# Patient Record
Sex: Male | Born: 1965 | Race: White | Hispanic: No | Marital: Married | Smoking: Never smoker
Health system: Southern US, Community
[De-identification: ages and names within clinical notes are randomized; demographics above are authoritative.]

## PROBLEM LIST (undated history)

## (undated) DIAGNOSIS — Z8781 Personal history of (healed) traumatic fracture: Secondary | ICD-10-CM

## (undated) DIAGNOSIS — F32A Depression, unspecified: Secondary | ICD-10-CM

## (undated) DIAGNOSIS — E785 Hyperlipidemia, unspecified: Secondary | ICD-10-CM

## (undated) DIAGNOSIS — T7840XA Allergy, unspecified, initial encounter: Secondary | ICD-10-CM

## (undated) DIAGNOSIS — K219 Gastro-esophageal reflux disease without esophagitis: Secondary | ICD-10-CM

## (undated) DIAGNOSIS — I10 Essential (primary) hypertension: Secondary | ICD-10-CM

## (undated) DIAGNOSIS — I471 Supraventricular tachycardia, unspecified: Secondary | ICD-10-CM

## (undated) DIAGNOSIS — Z87820 Personal history of traumatic brain injury: Secondary | ICD-10-CM

## (undated) HISTORY — PX: FRACTURE SURGERY: SHX138

## (undated) HISTORY — PX: SHOULDER SURGERY: SHX246

## (undated) HISTORY — DX: Essential (primary) hypertension: I10

## (undated) HISTORY — PX: OTHER SURGICAL HISTORY: SHX169

## (undated) HISTORY — DX: Depression, unspecified: F32.A

## (undated) HISTORY — DX: Hyperlipidemia, unspecified: E78.5

## (undated) HISTORY — PX: SEPTOPLASTY: SUR1290

## (undated) HISTORY — DX: Allergy, unspecified, initial encounter: T78.40XA

## (undated) HISTORY — DX: Gastro-esophageal reflux disease without esophagitis: K21.9

## (undated) HISTORY — DX: Personal history of traumatic brain injury: Z87.820

## (undated) HISTORY — PX: PENILE CYST REMOVAL: SHX2199

## (undated) HISTORY — PX: TONSILLECTOMY: SHX5217

---

## 2004-02-11 ENCOUNTER — Other Ambulatory Visit: Payer: Self-pay

## 2004-02-11 ENCOUNTER — Emergency Department: Payer: Self-pay | Admitting: Emergency Medicine

## 2005-03-26 ENCOUNTER — Ambulatory Visit: Payer: Self-pay | Admitting: Family Medicine

## 2008-01-01 ENCOUNTER — Ambulatory Visit: Payer: Self-pay | Admitting: Family Medicine

## 2008-01-16 ENCOUNTER — Ambulatory Visit: Payer: Self-pay | Admitting: Family Medicine

## 2008-06-04 ENCOUNTER — Emergency Department: Payer: Self-pay | Admitting: Emergency Medicine

## 2008-06-06 ENCOUNTER — Ambulatory Visit: Payer: Self-pay | Admitting: Internal Medicine

## 2008-06-10 ENCOUNTER — Emergency Department: Payer: Self-pay | Admitting: Emergency Medicine

## 2008-06-14 ENCOUNTER — Ambulatory Visit: Payer: Self-pay | Admitting: Sports Medicine

## 2009-03-03 ENCOUNTER — Ambulatory Visit: Payer: Self-pay | Admitting: Internal Medicine

## 2011-05-07 ENCOUNTER — Emergency Department: Payer: Self-pay | Admitting: Emergency Medicine

## 2011-05-07 LAB — CBC WITH DIFFERENTIAL/PLATELET
Basophil #: 0 10*3/uL (ref 0.0–0.1)
Comment - H1-Com3: NORMAL
Eosinophil #: 0.2 10*3/uL (ref 0.0–0.7)
Eosinophil: 5 %
HCT: 42.7 % (ref 40.0–52.0)
HGB: 15.2 g/dL (ref 13.0–18.0)
Lymphocyte #: 2.1 10*3/uL (ref 1.0–3.6)
MCH: 33.4 pg (ref 26.0–34.0)
MCHC: 35.6 g/dL (ref 32.0–36.0)
MCV: 94 fL (ref 80–100)
Monocyte %: 6.1 %
Neutrophil #: 3.9 10*3/uL (ref 1.4–6.5)
RDW: 13 % (ref 11.5–14.5)
WBC: 6.7 10*3/uL (ref 3.8–10.6)

## 2012-04-13 DIAGNOSIS — M25511 Pain in right shoulder: Secondary | ICD-10-CM | POA: Insufficient documentation

## 2013-04-12 ENCOUNTER — Emergency Department: Payer: Self-pay | Admitting: Emergency Medicine

## 2013-04-12 LAB — URINALYSIS, COMPLETE
BILIRUBIN, UR: NEGATIVE
BLOOD: NEGATIVE
Bacteria: NONE SEEN
Glucose,UR: NEGATIVE mg/dL (ref 0–75)
KETONE: NEGATIVE
Leukocyte Esterase: NEGATIVE
Nitrite: NEGATIVE
Ph: 7 (ref 4.5–8.0)
Protein: NEGATIVE
RBC,UR: 1 /HPF (ref 0–5)
Specific Gravity: 1.033 (ref 1.003–1.030)
Squamous Epithelial: NONE SEEN
WBC UR: 1 /HPF (ref 0–5)

## 2013-04-12 LAB — COMPREHENSIVE METABOLIC PANEL
ALT: 53 U/L (ref 12–78)
Albumin: 4.1 g/dL (ref 3.4–5.0)
Alkaline Phosphatase: 81 U/L
Anion Gap: 5 — ABNORMAL LOW (ref 7–16)
BUN: 13 mg/dL (ref 7–18)
Bilirubin,Total: 0.6 mg/dL (ref 0.2–1.0)
CO2: 24 mmol/L (ref 21–32)
Calcium, Total: 8.6 mg/dL (ref 8.5–10.1)
Chloride: 107 mmol/L (ref 98–107)
Creatinine: 0.94 mg/dL (ref 0.60–1.30)
EGFR (African American): >60
EGFR (Non-African Amer.): >60
Glucose: 87 mg/dL (ref 65–99)
Osmolality: 271 (ref 275–301)
POTASSIUM: 4.7 mmol/L (ref 3.5–5.1)
SGOT(AST): 48 U/L — ABNORMAL HIGH (ref 15–37)
Sodium: 136 mmol/L (ref 136–145)
Total Protein: 7.5 g/dL (ref 6.4–8.2)

## 2013-04-12 LAB — CBC
HCT: 41.6 % (ref 40.0–52.0)
HGB: 14.8 g/dL (ref 13.0–18.0)
MCH: 32.7 pg (ref 26.0–34.0)
MCHC: 35.6 g/dL (ref 32.0–36.0)
MCV: 92 fL (ref 80–100)
PLATELETS: 178 10*3/uL (ref 150–440)
RBC: 4.52 10*6/uL (ref 4.40–5.90)
RDW: 12.6 % (ref 11.5–14.5)
WBC: 6.3 10*3/uL (ref 3.8–10.6)

## 2013-04-16 ENCOUNTER — Emergency Department: Payer: Self-pay | Admitting: Emergency Medicine

## 2014-06-14 ENCOUNTER — Encounter: Payer: Self-pay | Admitting: Family Medicine

## 2014-06-14 DIAGNOSIS — E785 Hyperlipidemia, unspecified: Secondary | ICD-10-CM | POA: Insufficient documentation

## 2014-06-14 DIAGNOSIS — I10 Essential (primary) hypertension: Secondary | ICD-10-CM | POA: Insufficient documentation

## 2014-07-30 ENCOUNTER — Encounter: Payer: Self-pay | Admitting: Family Medicine

## 2014-08-08 ENCOUNTER — Encounter: Payer: Self-pay | Admitting: Family Medicine

## 2014-08-13 ENCOUNTER — Encounter: Payer: Self-pay | Admitting: Family Medicine

## 2014-09-26 ENCOUNTER — Ambulatory Visit (INDEPENDENT_AMBULATORY_CARE_PROVIDER_SITE_OTHER): Payer: 59 | Admitting: Family Medicine

## 2014-09-26 ENCOUNTER — Encounter: Payer: Self-pay | Admitting: Family Medicine

## 2014-09-26 VITALS — BP 100/65 | HR 59 | Temp 98.2°F | Ht 73.0 in | Wt 248.0 lb

## 2014-09-26 DIAGNOSIS — F32A Depression, unspecified: Secondary | ICD-10-CM | POA: Insufficient documentation

## 2014-09-26 DIAGNOSIS — Z0000 Encounter for general adult medical examination without abnormal findings: Secondary | ICD-10-CM | POA: Diagnosis not present

## 2014-09-26 DIAGNOSIS — E039 Hypothyroidism, unspecified: Secondary | ICD-10-CM

## 2014-09-26 DIAGNOSIS — E785 Hyperlipidemia, unspecified: Secondary | ICD-10-CM | POA: Diagnosis not present

## 2014-09-26 DIAGNOSIS — I10 Essential (primary) hypertension: Secondary | ICD-10-CM

## 2014-09-26 DIAGNOSIS — F329 Major depressive disorder, single episode, unspecified: Secondary | ICD-10-CM | POA: Diagnosis not present

## 2014-09-26 LAB — URINALYSIS, ROUTINE W REFLEX MICROSCOPIC
Bilirubin, UA: NEGATIVE
GLUCOSE, UA: NEGATIVE
Leukocytes, UA: NEGATIVE
NITRITE UA: NEGATIVE
RBC, UA: NEGATIVE
SPEC GRAV UA: 1.02 (ref 1.005–1.030)
UUROB: 0.2 mg/dL (ref 0.2–1.0)
pH, UA: 7 (ref 5.0–7.5)

## 2014-09-26 MED ORDER — BENAZEPRIL HCL 40 MG PO TABS: 40.0000 mg | ORAL_TABLET | Freq: Every day | ORAL | Status: DC

## 2014-09-26 MED ORDER — ATORVASTATIN CALCIUM 40 MG PO TABS: 40.0000 mg | ORAL_TABLET | Freq: Every day | ORAL | Status: DC

## 2014-09-26 MED ORDER — AMLODIPINE BESYLATE 5 MG PO TABS: 5.0000 mg | ORAL_TABLET | Freq: Every day | ORAL | Status: DC

## 2014-09-26 MED ORDER — PAROXETINE HCL 20 MG PO TABS: 20.0000 mg | ORAL_TABLET | Freq: Every day | ORAL | Status: DC

## 2014-09-26 NOTE — Progress Notes (Addendum)
BP 100/65 mmHg  Pulse 59  Temp(Src) 98.2 F (36.8 C)  Ht 6\' 1"  (1.854 m)  Wt 248 lb (112.492 kg)  BMI 32.73 kg/m2  SpO2 97%   Subjective:    Patient ID: Anthony Hunter, male    DOB: [birth year], 49 y.o.   MRN: [id number]  HPI: Anthony Hunter is a 49 y.o. male  Chief Complaint  Patient presents with  . Annual Exam   patient doing well with blood pressure going well with weight loss and training for [employer]. Is having some lightheaded dizziness with going from lying to standing. Blood pressure is been doing very well No issues with atorvastatin 40 mg taken half a tablet.  Relevant past medical, surgical, family and social history reviewed and updated as indicated. Interim medical history since our last visit reviewed. Allergies and medications reviewed and updated.  Review of Systems  Constitutional: Negative.   HENT: Negative.   Eyes: Negative.   Respiratory: Negative.   Cardiovascular: Negative.   Endocrine: Negative.   Musculoskeletal: Negative.   Skin: Negative.   Allergic/Immunologic: Negative.   Neurological: Negative.   Hematological: Negative.   Psychiatric/Behavioral: Negative.     Per HPI unless specifically indicated above     Objective:    BP 100/65 mmHg  Pulse 59  Temp(Src) 98.2 F (36.8 C)  Ht 6\' 1"  (1.854 m)  Wt 248 lb (112.492 kg)  BMI 32.73 kg/m2  SpO2 97%  Wt Readings from Last 3 Encounters:  09/26/14 248 lb (112.492 kg)  05/29/14 259 lb (117.482 kg)    Physical Exam  Constitutional: He is oriented to person, place, and time. He appears well-developed and well-nourished.  HENT:  Head: Normocephalic and atraumatic.  Right Ear: External ear normal.  Left Ear: External ear normal.  Eyes: Conjunctivae and EOM are normal. Pupils are equal, round, and reactive to light.  Neck: Normal range of motion. Neck supple.  Cardiovascular: Normal rate, regular rhythm, normal heart sounds and intact distal pulses.   Pulmonary/Chest: Effort normal and breath  sounds normal.  Abdominal: Soft. Bowel sounds are normal. There is no splenomegaly or hepatomegaly.  Genitourinary: Rectum normal, prostate normal and penis normal.  Musculoskeletal: Normal range of motion.  Neurological: He is alert and oriented to person, place, and time. He has normal reflexes.  Skin: No rash noted. No erythema.  Psychiatric: He has a normal mood and affect. His behavior is normal. Judgment and thought content normal.        Assessment & Plan:   Problem List Items Addressed This Visit      Cardiovascular and Mediastinum   Hypertension - Primary    Discussed blood pressure being low with weight loss and exercise will decrease amlodipine from 10 mg to 5 mg May need to discontinue. We'll keep up with symptoms and blood pressure.      Relevant Medications   atorvastatin (LIPITOR) 40 MG tablet   benazepril (LOTENSIN) 40 MG tablet   amLODipine (NORVASC) 5 MG tablet     Other   Hyperlipidemia    The current medical regimen is effective;  continue present plan and medications.       Relevant Medications   atorvastatin (LIPITOR) 40 MG tablet   benazepril (LOTENSIN) 40 MG tablet   amLODipine (NORVASC) 5 MG tablet   Depression    The current medical regimen is effective;  continue present plan and medications.       Relevant Medications   PARoxetine (PAXIL) 20 MG tablet  Other Visit Diagnoses    PE (physical exam), annual        Relevant Orders    CBC with Differential/Platelet    Comprehensive metabolic panel    Urinalysis, Routine w reflex microscopic (not at [facility])    TSH    PSA    Lipid panel        Follow up plan: Return in about 6 months (around 03/29/2015), or if symptoms worsen or fail to improve, for recheck.      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 Patient will recheck TSH 2-3 weeks.

## 2014-09-26 NOTE — Assessment & Plan Note (Signed)
Discussed blood pressure being low with weight loss and exercise will decrease amlodipine from 10 mg to 5 mg May need to discontinue. We'll keep up with symptoms and blood pressure.

## 2014-09-26 NOTE — Assessment & Plan Note (Signed)
The current medical regimen is effective;  continue present plan and medications.  

## 2014-09-27 LAB — COMPREHENSIVE METABOLIC PANEL
A/G RATIO: 2 (ref 1.1–2.5)
ALT: 28 IU/L (ref 0–44)
AST: 30 IU/L (ref 0–40)
Albumin: 4.6 g/dL (ref 3.5–5.5)
Alkaline Phosphatase: 88 IU/L (ref 39–117)
BILIRUBIN TOTAL: 0.9 mg/dL (ref 0.0–1.2)
BUN/Creatinine Ratio: 10 (ref 9–20)
BUN: 11 mg/dL (ref 6–24)
CALCIUM: 9.6 mg/dL (ref 8.7–10.2)
CHLORIDE: 102 mmol/L (ref 97–108)
CO2: 25 mmol/L (ref 18–29)
CREATININE: 1.05 mg/dL (ref 0.76–1.27)
GFR calc Af Amer: 97 mL/min/{1.73_m2} (ref >59)
GFR, EST NON AFRICAN AMERICAN: 84 mL/min/{1.73_m2} (ref >59)
Globulin, Total: 2.3 g/dL (ref 1.5–4.5)
Glucose: 93 mg/dL (ref 65–99)
Potassium: 4.5 mmol/L (ref 3.5–5.2)
SODIUM: 142 mmol/L (ref 134–144)
TOTAL PROTEIN: 6.9 g/dL (ref 6.0–8.5)

## 2014-09-27 LAB — CBC WITH DIFFERENTIAL/PLATELET
Basophils Absolute: 0 10*3/uL (ref 0.0–0.2)
Basos: 0 %
EOS (ABSOLUTE): 0.1 10*3/uL (ref 0.0–0.4)
EOS: 3 %
Hematocrit: 41.6 % (ref 37.5–51.0)
Hemoglobin: 14.2 g/dL (ref 12.6–17.7)
IMMATURE GRANULOCYTES: 0 %
Immature Grans (Abs): 0 10*3/uL (ref 0.0–0.1)
LYMPHS ABS: 1.9 10*3/uL (ref 0.7–3.1)
Lymphs: 34 %
MCH: 32.2 pg (ref 26.6–33.0)
MCHC: 34.1 g/dL (ref 31.5–35.7)
MCV: 94 fL (ref 79–97)
MONOS ABS: 0.3 10*3/uL (ref 0.1–0.9)
Monocytes: 5 %
Neutrophils Absolute: 3.2 10*3/uL (ref 1.4–7.0)
Neutrophils: 58 %
PLATELETS: 207 10*3/uL (ref 150–379)
RBC: 4.41 x10E6/uL (ref 4.14–5.80)
RDW: 13.4 % (ref 12.3–15.4)
WBC: 5.6 10*3/uL (ref 3.4–10.8)

## 2014-09-27 LAB — LIPID PANEL
CHOLESTEROL TOTAL: 148 mg/dL (ref 100–199)
Chol/HDL Ratio: 2.8 ratio units (ref 0.0–5.0)
HDL: 52 mg/dL (ref >39)
LDL Calculated: 66 mg/dL (ref 0–99)
Triglycerides: 151 mg/dL — ABNORMAL HIGH (ref 0–149)
VLDL Cholesterol Cal: 30 mg/dL (ref 5–40)

## 2014-09-27 LAB — TSH: TSH: 4.62 u[IU]/mL — AB (ref 0.450–4.500)

## 2014-09-27 LAB — PSA: PROSTATE SPECIFIC AG, SERUM: 0.9 ng/mL (ref 0.0–4.0)

## 2014-09-30 NOTE — Addendum Note (Signed)
Addended byGolden Pop on: 09/30/2014 12:29 PM   Modules accepted: Orders, SmartSet

## 2015-02-10 ENCOUNTER — Other Ambulatory Visit: Payer: Self-pay | Admitting: Family Medicine

## 2015-04-01 ENCOUNTER — Encounter: Payer: Self-pay | Admitting: Family Medicine

## 2015-04-01 ENCOUNTER — Ambulatory Visit (INDEPENDENT_AMBULATORY_CARE_PROVIDER_SITE_OTHER): Payer: 59 | Admitting: Family Medicine

## 2015-04-01 VITALS — BP 123/85 | HR 71 | Temp 98.5°F | Ht 74.0 in | Wt 262.0 lb

## 2015-04-01 DIAGNOSIS — E039 Hypothyroidism, unspecified: Secondary | ICD-10-CM

## 2015-04-01 DIAGNOSIS — F329 Major depressive disorder, single episode, unspecified: Secondary | ICD-10-CM

## 2015-04-01 DIAGNOSIS — I10 Essential (primary) hypertension: Secondary | ICD-10-CM

## 2015-04-01 DIAGNOSIS — E785 Hyperlipidemia, unspecified: Secondary | ICD-10-CM

## 2015-04-01 DIAGNOSIS — F32A Depression, unspecified: Secondary | ICD-10-CM

## 2015-04-01 MED ORDER — AMLODIPINE BESYLATE 10 MG PO TABS: 10.0000 mg | ORAL_TABLET | Freq: Every day | ORAL | Status: DC

## 2015-04-01 NOTE — Assessment & Plan Note (Signed)
The current medical regimen is effective;  continue present plan and medications.  

## 2015-04-01 NOTE — Assessment & Plan Note (Signed)
Doing well with medications no complaints

## 2015-04-01 NOTE — Progress Notes (Signed)
BP 123/85 mmHg  Pulse 71  Temp(Src) 98.5 F (36.9 C)  Ht 6\' 2"  (1.88 m)  Wt 262 lb (118.842 kg)  BMI 33.62 kg/m2  SpO2 96%   Subjective:    Patient ID: Anthony Hunter, male    DOB: [birth year], 50 y.o.   MRN: [id number]  HPI: Anthony Hunter is a 50 y.o. male  Chief Complaint  Patient presents with  . Hypertension  . Hypothyroidism   patient recheck medications doing well with no side effects taking medications faithfully Blood pressure good control Last TSH was elevated patient with no thyroid symptoms Relevant past medical, surgical, family and social history reviewed and updated as indicated. Interim medical history since our last visit reviewed. Allergies and medications reviewed and updated.  Review of Systems  Constitutional: Negative.   Respiratory: Negative.   Cardiovascular: Negative.     Per HPI unless specifically indicated above     Objective:    BP 123/85 mmHg  Pulse 71  Temp(Src) 98.5 F (36.9 C)  Ht 6\' 2"  (1.88 m)  Wt 262 lb (118.842 kg)  BMI 33.62 kg/m2  SpO2 96%  Wt Readings from Last 3 Encounters:  04/01/15 262 lb (118.842 kg)  09/26/14 248 lb (112.492 kg)  05/29/14 259 lb (117.482 kg)    Physical Exam  Constitutional: He is oriented to person, place, and time. He appears well-developed and well-nourished. No distress.  HENT:  Head: Normocephalic and atraumatic.  Right Ear: Hearing normal.  Left Ear: Hearing normal.  Nose: Nose normal.  Eyes: Conjunctivae and lids are normal. Right eye exhibits no discharge. Left eye exhibits no discharge. No scleral icterus.  Neck: Normal range of motion. Neck supple. No thyromegaly present.  Cardiovascular: Normal rate, regular rhythm and normal heart sounds.   Pulmonary/Chest: Effort normal and breath sounds normal. No respiratory distress.  Musculoskeletal: Normal range of motion.  Neurological: He is alert and oriented to person, place, and time.  Skin: Skin is intact. No rash noted.  Psychiatric: He has a  normal mood and affect. His speech is normal and behavior is normal. Judgment and thought content normal. Cognition and memory are normal.    Results for orders placed or performed in visit on 09/26/14  CBC with Differential/Platelet  Result Value Ref Range   WBC 5.6 3.4 - 10.8 x10E3/uL   RBC 4.41 4.14 - 5.80 x10E6/uL   Hemoglobin 14.2 12.6 - 17.7 g/dL   Hematocrit 41.6 37.5 - 51.0 %   MCV 94 79 - 97 fL   MCH 32.2 26.6 - 33.0 pg   MCHC 34.1 31.5 - 35.7 g/dL   RDW 13.4 12.3 - 15.4 %   Platelets 207 150 - 379 x10E3/uL   Neutrophils 58 %   Lymphs 34 %   Monocytes 5 %   Eos 3 %   Basos 0 %   Neutrophils Absolute 3.2 1.4 - 7.0 x10E3/uL   Lymphocytes Absolute 1.9 0.7 - 3.1 x10E3/uL   Monocytes Absolute 0.3 0.1 - 0.9 x10E3/uL   EOS (ABSOLUTE) 0.1 0.0 - 0.4 x10E3/uL   Basophils Absolute 0.0 0.0 - 0.2 x10E3/uL   Immature Granulocytes 0 %   Immature Grans (Abs) 0.0 0.0 - 0.1 x10E3/uL  Comprehensive metabolic panel  Result Value Ref Range   Glucose 93 65 - 99 mg/dL   BUN 11 6 - 24 mg/dL   Creatinine, Ser 1.05 0.76 - 1.27 mg/dL   GFR calc non Af Amer 84 >59 mL/min/1.73   GFR calc Af Amer 97 >  59 mL/min/1.73   BUN/Creatinine Ratio 10 9 - 20   Sodium 142 134 - 144 mmol/L   Potassium 4.5 3.5 - 5.2 mmol/L   Chloride 102 97 - 108 mmol/L   CO2 25 18 - 29 mmol/L   Calcium 9.6 8.7 - 10.2 mg/dL   Total Protein 6.9 6.0 - 8.5 g/dL   Albumin 4.6 3.5 - 5.5 g/dL   Globulin, Total 2.3 1.5 - 4.5 g/dL   Albumin/Globulin Ratio 2.0 1.1 - 2.5   Bilirubin Total 0.9 0.0 - 1.2 mg/dL   Alkaline Phosphatase 88 39 - 117 IU/L   AST 30 0 - 40 IU/L   ALT 28 0 - 44 IU/L  Urinalysis, Routine w reflex microscopic (not at [facility])  Result Value Ref Range   Specific Gravity, UA 1.020 1.005 - 1.030   pH, UA 7.0 5.0 - 7.5   Color, UA Yellow Yellow   Appearance Ur Clear Clear   Leukocytes, UA Negative Negative   Protein, UA Trace Negative/Trace   Glucose, UA Negative Negative   Ketones, UA Trace (A) Negative    RBC, UA Negative Negative   Bilirubin, UA Negative Negative   Urobilinogen, Ur 0.2 0.2 - 1.0 mg/dL   Nitrite, UA Negative Negative  TSH  Result Value Ref Range   TSH 4.620 (H) 0.450 - 4.500 uIU/mL  PSA  Result Value Ref Range   Prostate Specific Ag, Serum 0.9 0.0 - 4.0 ng/mL  Lipid panel  Result Value Ref Range   Cholesterol, Total 148 100 - 199 mg/dL   Triglycerides 151 (H) 0 - 149 mg/dL   HDL 52 >39 mg/dL   VLDL Cholesterol Cal 30 5 - 40 mg/dL   LDL Calculated 66 0 - 99 mg/dL   Chol/HDL Ratio 2.8 0.0 - 5.0 ratio units      Assessment & Plan:   Problem List Items Addressed This Visit      Cardiovascular and Mediastinum   Hypertension - Primary    The current medical regimen is effective;  continue present plan and medications.       Relevant Medications   amLODipine (NORVASC) 10 MG tablet   Other Relevant Orders   Basic metabolic panel     Other   Hyperlipidemia    Doing well with medications no complaints      Relevant Medications   amLODipine (NORVASC) 10 MG tablet   Other Relevant Orders   ALT   AST   Lipid panel   Depression    The current medical regimen is effective;  continue present plan and medications.        Other Visit Diagnoses    Hypothyroidism, unspecified hypothyroidism type            Follow up plan: Return in about 6 months (around 09/29/2015) for Physical Exam.

## 2015-04-02 ENCOUNTER — Telehealth: Payer: Self-pay | Admitting: Family Medicine

## 2015-04-02 ENCOUNTER — Encounter: Payer: Self-pay | Admitting: Family Medicine

## 2015-04-02 LAB — BASIC METABOLIC PANEL
BUN/Creatinine Ratio: 8 — ABNORMAL LOW (ref 9–20)
BUN: 8 mg/dL (ref 6–24)
CALCIUM: 9.5 mg/dL (ref 8.7–10.2)
CHLORIDE: 100 mmol/L (ref 96–106)
CO2: 23 mmol/L (ref 18–29)
Creatinine, Ser: 0.95 mg/dL (ref 0.76–1.27)
GFR calc Af Amer: 109 mL/min/{1.73_m2} (ref >59)
GFR calc non Af Amer: 94 mL/min/{1.73_m2} (ref >59)
GLUCOSE: 102 mg/dL — AB (ref 65–99)
POTASSIUM: 4.1 mmol/L (ref 3.5–5.2)
Sodium: 140 mmol/L (ref 134–144)

## 2015-04-02 LAB — LIPID PANEL
CHOLESTEROL TOTAL: 165 mg/dL (ref 100–199)
Chol/HDL Ratio: 3.5 ratio units (ref 0.0–5.0)
HDL: 47 mg/dL (ref >39)
LDL CALC: 71 mg/dL (ref 0–99)
Triglycerides: 235 mg/dL — ABNORMAL HIGH (ref 0–149)
VLDL Cholesterol Cal: 47 mg/dL — ABNORMAL HIGH (ref 5–40)

## 2015-04-02 LAB — ALT: ALT: 33 IU/L (ref 0–44)

## 2015-04-02 LAB — AST: AST: 24 IU/L (ref 0–40)

## 2015-04-02 LAB — TSH: TSH: 4.7 u[IU]/mL — ABNORMAL HIGH (ref 0.450–4.500)

## 2015-04-02 NOTE — Telephone Encounter (Signed)
-----   Message from Wynn Maudlin, [place] sent at 04/02/2015  4:52 PM EST ----- labs

## 2015-04-02 NOTE — Telephone Encounter (Signed)
Phone call Discussed with patient elevated TSH patient with no subclinical thyroid symptoms will observe for now recheck at physical earlier if has problems especially with intense exercises patient's training for [employer].

## 2015-07-15 ENCOUNTER — Emergency Department
Admission: EM | Admit: 2015-07-15 | Discharge: 2015-07-15 | Disposition: A | Discharge status: Home / Self Care | Payer: 59 | Attending: Emergency Medicine | Admitting: Emergency Medicine

## 2015-07-15 ENCOUNTER — Encounter: Payer: Self-pay | Admitting: Emergency Medicine

## 2015-07-15 DIAGNOSIS — Y929 Unspecified place or not applicable: Secondary | ICD-10-CM | POA: Insufficient documentation

## 2015-07-15 DIAGNOSIS — I10 Essential (primary) hypertension: Secondary | ICD-10-CM | POA: Diagnosis not present

## 2015-07-15 DIAGNOSIS — Y939 Activity, unspecified: Secondary | ICD-10-CM | POA: Diagnosis not present

## 2015-07-15 DIAGNOSIS — E785 Hyperlipidemia, unspecified: Secondary | ICD-10-CM | POA: Insufficient documentation

## 2015-07-15 DIAGNOSIS — S50861A Insect bite (nonvenomous) of right forearm, initial encounter: Secondary | ICD-10-CM | POA: Diagnosis present

## 2015-07-15 DIAGNOSIS — Y999 Unspecified external cause status: Secondary | ICD-10-CM | POA: Diagnosis not present

## 2015-07-15 DIAGNOSIS — Z79899 Other long term (current) drug therapy: Secondary | ICD-10-CM | POA: Diagnosis not present

## 2015-07-15 DIAGNOSIS — F329 Major depressive disorder, single episode, unspecified: Secondary | ICD-10-CM | POA: Diagnosis not present

## 2015-07-15 DIAGNOSIS — W57XXXA Bitten or stung by nonvenomous insect and other nonvenomous arthropods, initial encounter: Secondary | ICD-10-CM | POA: Diagnosis not present

## 2015-07-15 DIAGNOSIS — L03113 Cellulitis of right upper limb: Secondary | ICD-10-CM | POA: Diagnosis not present

## 2015-07-15 DIAGNOSIS — L089 Local infection of the skin and subcutaneous tissue, unspecified: Secondary | ICD-10-CM

## 2015-07-15 MED ORDER — DOXYCYCLINE HYCLATE 100 MG PO TABS: 100.0000 mg | ORAL_TABLET | Freq: Two times a day (BID) | ORAL | Status: DC

## 2015-07-15 NOTE — ED Notes (Signed)
Presents with possible spider bite 2 days ago   Redness to arm

## 2015-07-15 NOTE — Discharge Instructions (Signed)

## 2015-07-15 NOTE — ED Notes (Signed)
Large red area noted to left forearm with red streak moving up arm
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
## 2015-07-31 ENCOUNTER — Ambulatory Visit (INDEPENDENT_AMBULATORY_CARE_PROVIDER_SITE_OTHER): Payer: 59 | Admitting: Family Medicine

## 2015-07-31 ENCOUNTER — Encounter: Payer: Self-pay | Admitting: Family Medicine

## 2015-07-31 VITALS — BP 128/88 | HR 69 | Temp 98.7°F | Wt 267.0 lb

## 2015-07-31 DIAGNOSIS — L03314 Cellulitis of groin: Secondary | ICD-10-CM

## 2015-07-31 MED ORDER — SULFAMETHOXAZOLE-TRIMETHOPRIM 800-160 MG PO TABS: 1.0000 | ORAL_TABLET | Freq: Two times a day (BID) | ORAL | Status: DC

## 2015-07-31 NOTE — Progress Notes (Signed)
BP 128/88 mmHg  Pulse 69  Temp(Src) 98.7 F (37.1 C)  Wt 267 lb (121.11 kg)  SpO2 100%   Subjective:    Patient ID: Anthony Hunter, male    DOB: Jan 23, 1966, 50 y.o.   MRN: [id number]  HPI: Jameer Diab is a 50 y.o. male  Chief Complaint  Patient presents with  . Mass    in groin, noticed two weeks ago, over the last three days it has gotten a lot larger   LUMP Duration: 2.5 weeks over night got much larger and he got very concerned Location: L groin Onset: sudden Painful: yes since yesterday Discomfort: yes Status:  bigger Trauma: no Redness: no Bruising: no Recent infection: yes- tick bite on his arm and was on doxy 2 weeks  Swollen lymph nodes: no Requesting removal: no History of cancer: no Family history of cancer: yes- brother had kidney cancer History of the same: no Associated signs and symptoms: not hot, changing color and getting darker  Relevant past medical, surgical, family and social history reviewed and updated as indicated. Interim medical history since our last visit reviewed. Allergies and medications reviewed and updated.  Review of Systems  Constitutional: Negative.   Respiratory: Negative.   Cardiovascular: Negative.     Per HPI unless specifically indicated above     Objective:    BP 128/88 mmHg  Pulse 69  Temp(Src) 98.7 F (37.1 C)  Wt 267 lb (121.11 kg)  SpO2 100%  Wt Readings from Last 3 Encounters:  07/31/15 267 lb (121.11 kg)  07/15/15 262 lb (118.842 kg)  04/01/15 262 lb (118.842 kg)    Physical Exam  Constitutional: He is oriented to person, place, and time. He appears well-developed and well-nourished. No distress.  HENT:  Head: Normocephalic and atraumatic.  Right Ear: Hearing normal.  Left Ear: Hearing normal.  Nose: Nose normal.  Eyes: Conjunctivae and lids are normal. Right eye exhibits no discharge. Left eye exhibits no discharge. No scleral icterus.  Pulmonary/Chest: Effort normal. No respiratory distress.   Musculoskeletal: Normal range of motion.  Neurological: He is alert and oriented to person, place, and time.  Skin: Skin is warm, dry and intact. No rash noted. There is erythema. No pallor.  2 inch erythematous lesion in L central groin, non-fluctuant, mild heat  Psychiatric: He has a normal mood and affect. His speech is normal and behavior is normal. Judgment and thought content normal. Cognition and memory are normal.    Results for orders placed or performed in visit on [id number]  Basic metabolic panel  Result Value Ref Range   Glucose 102 (H) 65 - 99 mg/dL   BUN 8 6 - 24 mg/dL   Creatinine, Ser 0.95 0.76 - 1.27 mg/dL   GFR calc non Af Amer 94 >59 mL/min/1.73   GFR calc Af Amer 109 >59 mL/min/1.73   BUN/Creatinine Ratio 8 (L) 9 - 20   Sodium 140 134 - 144 mmol/L   Potassium 4.1 3.5 - 5.2 mmol/L   Chloride 100 96 - 106 mmol/L   CO2 23 18 - 29 mmol/L   Calcium 9.5 8.7 - 10.2 mg/dL  ALT  Result Value Ref Range   ALT 33 0 - 44 IU/L  AST  Result Value Ref Range   AST 24 0 - 40 IU/L  Lipid panel  Result Value Ref Range   Cholesterol, Total 165 100 - 199 mg/dL   Triglycerides 235 (H) 0 - 149 mg/dL   HDL 47 >39 mg/dL  VLDL Cholesterol Cal 47 (H) 5 - 40 mg/dL   LDL Calculated 71 0 - 99 mg/dL   Chol/HDL Ratio 3.5 0.0 - 5.0 ratio units  TSH  Result Value Ref Range   TSH 4.700 (H) 0.450 - 4.500 uIU/mL      Assessment & Plan:   Problem List Items Addressed This Visit    None    Visit Diagnoses    Cellulitis of groin    -  Primary    Will treat with bactrim. Use hot compresses as neeeded If not better by tomorrow or more flucuant, return for I&D        Follow up plan: Return if symptoms worsen or fail to improve.

## 2015-07-31 NOTE — Patient Instructions (Signed)

## 2015-09-28 ENCOUNTER — Other Ambulatory Visit: Payer: Self-pay | Admitting: Family Medicine

## 2015-09-28 DIAGNOSIS — F32A Depression, unspecified: Secondary | ICD-10-CM

## 2015-09-28 DIAGNOSIS — F329 Major depressive disorder, single episode, unspecified: Secondary | ICD-10-CM

## 2015-09-28 DIAGNOSIS — I10 Essential (primary) hypertension: Secondary | ICD-10-CM

## 2015-09-28 DIAGNOSIS — E785 Hyperlipidemia, unspecified: Secondary | ICD-10-CM

## 2016-05-05 ENCOUNTER — Emergency Department (HOSPITAL_COMMUNITY): Payer: 59

## 2016-05-05 ENCOUNTER — Encounter (HOSPITAL_COMMUNITY): Payer: Self-pay | Admitting: Emergency Medicine

## 2016-05-05 ENCOUNTER — Emergency Department (HOSPITAL_COMMUNITY)
Admission: EM | Admit: 2016-05-05 | Discharge: 2016-05-05 | Disposition: A | Discharge status: Home / Self Care | Payer: 59 | Attending: Emergency Medicine | Admitting: Emergency Medicine

## 2016-05-05 DIAGNOSIS — N200 Calculus of kidney: Secondary | ICD-10-CM

## 2016-05-05 DIAGNOSIS — I10 Essential (primary) hypertension: Secondary | ICD-10-CM | POA: Diagnosis not present

## 2016-05-05 DIAGNOSIS — R109 Unspecified abdominal pain: Secondary | ICD-10-CM

## 2016-05-05 LAB — URINALYSIS, ROUTINE W REFLEX MICROSCOPIC
Bilirubin Urine: NEGATIVE
Glucose, UA: NEGATIVE mg/dL
Hgb urine dipstick: NEGATIVE
KETONES UR: NEGATIVE mg/dL
Leukocytes, UA: NEGATIVE
NITRITE: NEGATIVE
PH: 7 (ref 5.0–8.0)
Protein, ur: NEGATIVE mg/dL
Specific Gravity, Urine: 1.016 (ref 1.005–1.030)

## 2016-05-05 LAB — CBC
HEMATOCRIT: 42.3 % (ref 39.0–52.0)
HEMOGLOBIN: 14.5 g/dL (ref 13.0–17.0)
MCH: 31.3 pg (ref 26.0–34.0)
MCHC: 34.3 g/dL (ref 30.0–36.0)
MCV: 91.2 fL (ref 78.0–100.0)
Platelets: 186 10*3/uL (ref 150–400)
RBC: 4.64 MIL/uL (ref 4.22–5.81)
RDW: 12.5 % (ref 11.5–15.5)
WBC: 6.8 10*3/uL (ref 4.0–10.5)

## 2016-05-05 LAB — COMPREHENSIVE METABOLIC PANEL
ALT: 48 U/L (ref 17–63)
ANION GAP: 10 (ref 5–15)
AST: 37 U/L (ref 15–41)
Albumin: 4.1 g/dL (ref 3.5–5.0)
Alkaline Phosphatase: 84 U/L (ref 38–126)
BILIRUBIN TOTAL: 0.8 mg/dL (ref 0.3–1.2)
BUN: 8 mg/dL (ref 6–20)
CALCIUM: 9.2 mg/dL (ref 8.9–10.3)
CO2: 24 mmol/L (ref 22–32)
Chloride: 106 mmol/L (ref 101–111)
Creatinine, Ser: 0.99 mg/dL (ref 0.61–1.24)
GFR calc Af Amer: >60 mL/min (ref >60)
Glucose, Bld: 107 mg/dL — ABNORMAL HIGH (ref 65–99)
POTASSIUM: 3.8 mmol/L (ref 3.5–5.1)
Sodium: 140 mmol/L (ref 135–145)
TOTAL PROTEIN: 6.8 g/dL (ref 6.5–8.1)

## 2016-05-05 LAB — LIPASE, BLOOD: Lipase: 15 U/L (ref 11–51)

## 2016-05-05 MED ORDER — ONDANSETRON 4 MG PO TBDP: 4.0000 mg | ORAL_TABLET | Freq: Three times a day (TID) | ORAL | 0 refills | Status: DC | PRN

## 2016-05-05 NOTE — ED Notes (Signed)
Pt A&OX4, ambulatory at d/c with independent steady gait, NAD

## 2016-05-05 NOTE — Discharge Instructions (Signed)
Your CT scan and lab work were reassuring today.  Your flank pain was most likely due to a recently pass kidney stone.  Please monitor your symptoms and return to ED if your flank pain worsens or returns. Take zofran for nausea as needed.

## 2016-05-05 NOTE — ED Triage Notes (Signed)
Pt developed sudden onset of left sided flank pain. Denies urinary symptoms. Pt feels nauseated.

## 2016-05-05 NOTE — ED Provider Notes (Signed)
Complains of flank pain rating to groin onset this morning while driving to work. Symptoms have improved spontaneously without treatment. Nothing makes symptoms better or worse. No other associated symptoms he's never had similar symptoms in the past. On exam no distress lungs clear auscultation abdomen nontender. No flank tenderness. Genitalia normal male   Orlie Dakin, MD 05/05/16 1113
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
## 2016-09-30 LAB — BASIC METABOLIC PANEL: Glucose: 90

## 2016-09-30 LAB — LIPID PANEL
Cholesterol: 216 — AB (ref 0–200)
HDL: 51 (ref 35–70)
LDL Cholesterol: 120
Triglycerides: 227 — AB (ref 40–160)

## 2016-09-30 LAB — HEMOGLOBIN A1C: HEMOGLOBIN A1C: 5.1

## 2016-10-17 ENCOUNTER — Other Ambulatory Visit: Payer: Self-pay | Admitting: Family Medicine

## 2016-10-17 DIAGNOSIS — F32A Depression, unspecified: Secondary | ICD-10-CM

## 2016-10-17 DIAGNOSIS — F329 Major depressive disorder, single episode, unspecified: Secondary | ICD-10-CM

## 2016-10-19 NOTE — Telephone Encounter (Signed)
Needs a follow up to get refills.

## 2016-10-20 ENCOUNTER — Ambulatory Visit (INDEPENDENT_AMBULATORY_CARE_PROVIDER_SITE_OTHER): Payer: 59 | Admitting: Family Medicine

## 2016-10-20 ENCOUNTER — Encounter: Payer: Self-pay | Admitting: Family Medicine

## 2016-10-20 VITALS — BP 134/84 | HR 60 | Temp 97.7°F | Ht 73.0 in | Wt 255.0 lb

## 2016-10-20 DIAGNOSIS — Z0000 Encounter for general adult medical examination without abnormal findings: Secondary | ICD-10-CM | POA: Diagnosis not present

## 2016-10-20 DIAGNOSIS — R946 Abnormal results of thyroid function studies: Secondary | ICD-10-CM | POA: Diagnosis not present

## 2016-10-20 DIAGNOSIS — I10 Essential (primary) hypertension: Secondary | ICD-10-CM

## 2016-10-20 DIAGNOSIS — F3342 Major depressive disorder, recurrent, in full remission: Secondary | ICD-10-CM

## 2016-10-20 DIAGNOSIS — E782 Mixed hyperlipidemia: Secondary | ICD-10-CM | POA: Diagnosis not present

## 2016-10-20 DIAGNOSIS — Z1211 Encounter for screening for malignant neoplasm of colon: Secondary | ICD-10-CM | POA: Diagnosis not present

## 2016-10-20 DIAGNOSIS — R7989 Other specified abnormal findings of blood chemistry: Secondary | ICD-10-CM

## 2016-10-20 MED ORDER — PAROXETINE HCL 20 MG PO TABS: 20.0000 mg | ORAL_TABLET | Freq: Every day | ORAL | 11 refills | Status: DC
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
## 2016-10-20 NOTE — Assessment & Plan Note (Signed)
Doing very well on paxil. Medication refilled today.

## 2016-10-20 NOTE — Assessment & Plan Note (Signed)
Long discussion with patient, who is very much wanting to try and avoid going back on statins. Continue good lifestyle modifications and fish oil, can try red yeast rice. Will re-evaluate in 6 months at follow up.

## 2016-10-20 NOTE — Assessment & Plan Note (Signed)
BPs well controlled on current regimen, continue benazepril and amlodipine.

## 2016-10-21 ENCOUNTER — Encounter: Payer: Self-pay | Admitting: Family Medicine

## 2016-10-21 LAB — UA/M W/RFLX CULTURE, ROUTINE
Bilirubin, UA: NEGATIVE
GLUCOSE, UA: NEGATIVE
KETONES UA: NEGATIVE
LEUKOCYTES UA: NEGATIVE
Nitrite, UA: NEGATIVE
Protein, UA: NEGATIVE
RBC, UA: NEGATIVE
Urobilinogen, Ur: 0.2 mg/dL (ref 0.2–1.0)
pH, UA: 7 (ref 5.0–7.5)

## 2016-10-21 LAB — CBC WITH DIFFERENTIAL/PLATELET
BASOS ABS: 0 10*3/uL (ref 0.0–0.2)
Basos: 0 %
EOS (ABSOLUTE): 0.2 10*3/uL (ref 0.0–0.4)
EOS: 4 %
HEMATOCRIT: 42.8 % (ref 37.5–51.0)
Hemoglobin: 14.9 g/dL (ref 13.0–17.7)
IMMATURE GRANULOCYTES: 0 %
Immature Grans (Abs): 0 10*3/uL (ref 0.0–0.1)
Lymphocytes Absolute: 2 10*3/uL (ref 0.7–3.1)
Lymphs: 35 %
MCH: 32.5 pg (ref 26.6–33.0)
MCHC: 34.8 g/dL (ref 31.5–35.7)
MCV: 93 fL (ref 79–97)
MONOS ABS: 0.4 10*3/uL (ref 0.1–0.9)
Monocytes: 8 %
NEUTROS PCT: 53 %
Neutrophils Absolute: 3 10*3/uL (ref 1.4–7.0)
PLATELETS: 148 10*3/uL — AB (ref 150–379)
RBC: 4.59 x10E6/uL (ref 4.14–5.80)
RDW: 13.3 % (ref 12.3–15.4)
WBC: 5.6 10*3/uL (ref 3.4–10.8)

## 2016-10-21 LAB — MICROSCOPIC EXAMINATION
BACTERIA UA: NONE SEEN
WBC, UA: NONE SEEN /hpf (ref 0–?)

## 2016-10-21 LAB — COMPREHENSIVE METABOLIC PANEL
ALK PHOS: 80 IU/L (ref 39–117)
ALT: 20 IU/L (ref 0–44)
AST: 22 IU/L (ref 0–40)
Albumin/Globulin Ratio: 2.1 (ref 1.2–2.2)
Albumin: 4.6 g/dL (ref 3.5–5.5)
BUN/Creatinine Ratio: 17 (ref 9–20)
BUN: 16 mg/dL (ref 6–24)
Bilirubin Total: 0.6 mg/dL (ref 0.0–1.2)
CALCIUM: 9.1 mg/dL (ref 8.7–10.2)
CHLORIDE: 101 mmol/L (ref 96–106)
CO2: 23 mmol/L (ref 20–29)
CREATININE: 0.94 mg/dL (ref 0.76–1.27)
GFR calc Af Amer: 109 mL/min/{1.73_m2} (ref >59)
GFR, EST NON AFRICAN AMERICAN: 94 mL/min/{1.73_m2} (ref >59)
GLUCOSE: 81 mg/dL (ref 65–99)
Globulin, Total: 2.2 g/dL (ref 1.5–4.5)
Potassium: 4.6 mmol/L (ref 3.5–5.2)
Sodium: 139 mmol/L (ref 134–144)
Total Protein: 6.8 g/dL (ref 6.0–8.5)

## 2016-10-21 LAB — TSH: TSH: 3.27 u[IU]/mL (ref 0.450–4.500)

## 2016-11-16 ENCOUNTER — Other Ambulatory Visit: Payer: Self-pay | Admitting: Family Medicine

## 2016-11-16 DIAGNOSIS — I10 Essential (primary) hypertension: Secondary | ICD-10-CM

## 2016-11-16 NOTE — Telephone Encounter (Signed)
Routing to provider. Next appt on 04/22/17.

## 2016-11-29 ENCOUNTER — Other Ambulatory Visit: Payer: Self-pay | Admitting: Family Medicine

## 2016-11-29 DIAGNOSIS — I10 Essential (primary) hypertension: Secondary | ICD-10-CM

## 2017-04-18 ENCOUNTER — Telehealth: Payer: Self-pay

## 2017-04-18 DIAGNOSIS — F3342 Major depressive disorder, recurrent, in full remission: Secondary | ICD-10-CM

## 2017-04-18 MED ORDER — PAROXETINE HCL 20 MG PO TABS: 20.0000 mg | ORAL_TABLET | Freq: Every day | ORAL | 1 refills | Status: DC

## 2017-04-18 NOTE — Telephone Encounter (Signed)
90 Day Supply for Paxil Appt 04/22/17

## 2017-04-18 NOTE — Telephone Encounter (Signed)
Copied from [place]. Topic: Quick Communication - Patient Running Late >> Apr 18, 2017  1:57 PM Oneta Rack wrote: Relation to pt: self  Call back number: [phone] Pharmacy: [facility] [place], [place] [phone] (Phone) [phone] (Fax)  Reason for call:  Patient requesting 3 month supply PARoxetine (PAXIL) 20 MG tablet, due to the cost being cheaper and due to patient traveling. Patient confirmed appointment with Merrie Roof  for 04/22/17. Pharmacy advised patient to contact pcp office due to the increase in supply, please advise >> Apr 18, 2017  2:00 PM Oneta Rack wrote: Relation to pt: self  Call back number: [phone] Pharmacy: [facility] [place], [place] [phone] (Phone) [phone] (Fax)  Reason for call:  Patient requesting 3 month supply PARoxetine (PAXIL) 20 MG tablet, due to the cost being cheaper and due to patient traveling. Patient confirmed appointment with Merrie Roof  for 04/22/17. Pharmacy advised patient to contact pcp office due to the increase in supply, please advise

## 2017-04-18 NOTE — Telephone Encounter (Signed)
Rx sent 

## 2017-04-22 ENCOUNTER — Encounter: Payer: Self-pay | Admitting: Family Medicine

## 2017-04-22 ENCOUNTER — Ambulatory Visit: Payer: Managed Care, Other (non HMO) | Admitting: Family Medicine

## 2017-04-22 VITALS — BP 140/84 | HR 52 | Temp 97.5°F | Ht 73.0 in | Wt 239.3 lb

## 2017-04-22 DIAGNOSIS — Z8639 Personal history of other endocrine, nutritional and metabolic disease: Secondary | ICD-10-CM | POA: Insufficient documentation

## 2017-04-22 DIAGNOSIS — E782 Mixed hyperlipidemia: Secondary | ICD-10-CM | POA: Diagnosis not present

## 2017-04-22 DIAGNOSIS — F3342 Major depressive disorder, recurrent, in full remission: Secondary | ICD-10-CM

## 2017-04-22 DIAGNOSIS — I10 Essential (primary) hypertension: Secondary | ICD-10-CM

## 2017-04-22 NOTE — Progress Notes (Signed)
BP 140/84   Pulse (!) 52   Temp (!) 97.5 F (36.4 C) (Tympanic)   Ht 6\' 1"  (1.854 m)   Wt 239 lb 4.8 oz (108.5 kg)   SpO2 98%   BMI 31.57 kg/m    Subjective:    Patient ID: Anthony Hunter, male    DOB: 04-30-1965, 52 y.o.   MRN: [id number]  HPI: Anthony Hunter is a 52 y.o. male  Chief Complaint  Patient presents with  . Hypertension    Patient has lost weight and has been doing cross fit training. States he has had symptoms of low BP. Has not taken blood pressure medication +30 days.  . Vitamin D Follow Up   Pt here today for BP and cholesterol f/u. Was having hypotensive spells frequently while taking his BP medications. 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 Stopped all of his BP medications about a month ago due to these spells and no longer having issues. Home readings 130s/90s. Will be traveling to [place] in the next few weeks to compete. Denies CP, dizziness, syncope, SOB currently.  Wanted to try diet control for his cholesterol rather than starting a statin previously. Still hoping to continue this way. Has been training hard for his competition and has lost a significant amount of weight.   Continues to do well on paxil, no concerns or side effects. Feels it controls his moods well. Denies SI/HI, mood swings, sleep or appetite issues.   Also wanting his thyroid levels checked as they've been inconsistent in the past.  Depression screen [facility] 2/9 04/22/2017 10/20/2016  Decreased Interest 0 0  Down, Depressed, Hopeless 0 0  PHQ - 2 Score 0 0  Altered sleeping 0 -  Tired, decreased energy 0 -  Change in appetite 0 -  Feeling bad or failure about yourself  0 -  Trouble concentrating 0 -  Moving slowly or fidgety/restless 0 -  Suicidal thoughts 0 -  PHQ-9 Score 0 -    Relevant past medical, surgical, family and social history reviewed and updated as indicated. Interim medical history since our last visit reviewed. Allergies and medications reviewed and  updated.  Review of Systems  Per HPI unless specifically indicated above     Objective:    BP 140/84   Pulse (!) 52   Temp (!) 97.5 F (36.4 C) (Tympanic)   Ht 6\' 1"  (1.854 m)   Wt 239 lb 4.8 oz (108.5 kg)   SpO2 98%   BMI 31.57 kg/m   Wt Readings from Last 3 Encounters:  04/22/17 239 lb 4.8 oz (108.5 kg)  10/20/16 255 lb (115.7 kg)  05/05/16 250 lb (113.4 kg)    Physical Exam  Constitutional: He is oriented to person, place, and time. He appears well-developed and well-nourished. No distress.  HENT:  Head: Atraumatic.  Eyes: Conjunctivae are normal. Pupils are equal, round, and reactive to light.  Neck: Normal range of motion. Neck supple.  Cardiovascular: Normal rate and normal heart sounds.  Pulmonary/Chest: Effort normal and breath sounds normal. No respiratory distress.  Musculoskeletal: Normal range of motion.  Neurological: He is alert and oriented to person, place, and time.  Skin: Skin is warm and dry.  Psychiatric: He has a normal mood and affect. His behavior is normal.  Nursing note and vitals reviewed.   Results for orders placed or performed in visit on 04/22/17  Comprehensive metabolic panel  Result Value Ref Range   Glucose 97  65 - 99 mg/dL   BUN 17 6 - 24 mg/dL   Creatinine, Ser 0.96 0.76 - 1.27 mg/dL   GFR calc non Af Amer 92 >59 mL/min/1.73   GFR calc Af Amer 106 >59 mL/min/1.73   BUN/Creatinine Ratio 18 9 - 20   Sodium 140 134 - 144 mmol/L   Potassium 4.2 3.5 - 5.2 mmol/L   Chloride 103 96 - 106 mmol/L   CO2 24 20 - 29 mmol/L   Calcium 9.6 8.7 - 10.2 mg/dL   Total Protein 6.4 6.0 - 8.5 g/dL   Albumin 4.2 3.5 - 5.5 g/dL   Globulin, Total 2.2 1.5 - 4.5 g/dL   Albumin/Globulin Ratio 1.9 1.2 - 2.2   Bilirubin Total 0.5 0.0 - 1.2 mg/dL   Alkaline Phosphatase 64 39 - 117 IU/L   AST 28 0 - 40 IU/L   ALT 30 0 - 44 IU/L  Lipid Panel w/o Chol/HDL Ratio  Result Value Ref Range   Cholesterol, Total 202 (H) 100 - 199 mg/dL   Triglycerides 103 0 -  149 mg/dL   HDL 59 >39 mg/dL   VLDL Cholesterol Cal 21 5 - 40 mg/dL   LDL Calculated 122 (H) 0 - 99 mg/dL  TSH  Result Value Ref Range   TSH 5.030 (H) 0.450 - 4.500 uIU/mL      Assessment & Plan:   Problem List Items Addressed This Visit      Cardiovascular and Mediastinum   Hypertension - Primary    BPs high/normal today but given significant sxs on medication with his training will continue d/c for now. May add back 5 mg amlodipine alone at next visit and monitor for hypotension. 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 Will check levels today, continue fish oil and good diet and exercise.       Relevant Orders   Lipid Panel w/o Chol/HDL Ratio (Completed)   Depression    Stable, continue paxil. Well controlled      History of hypothyroidism    Recheck levels today. No new sxs      Relevant Orders   TSH (Completed)       Follow up plan: Return in about 6 months (around 10/23/2017) for CPE.

## 2017-04-23 LAB — LIPID PANEL W/O CHOL/HDL RATIO
Cholesterol, Total: 202 mg/dL — ABNORMAL HIGH (ref 100–199)
HDL: 59 mg/dL (ref >39)
LDL Calculated: 122 mg/dL — ABNORMAL HIGH (ref 0–99)
Triglycerides: 103 mg/dL (ref 0–149)
VLDL Cholesterol Cal: 21 mg/dL (ref 5–40)

## 2017-04-23 LAB — COMPREHENSIVE METABOLIC PANEL
A/G RATIO: 1.9 (ref 1.2–2.2)
ALBUMIN: 4.2 g/dL (ref 3.5–5.5)
ALT: 30 IU/L (ref 0–44)
AST: 28 IU/L (ref 0–40)
Alkaline Phosphatase: 64 IU/L (ref 39–117)
BUN / CREAT RATIO: 18 (ref 9–20)
BUN: 17 mg/dL (ref 6–24)
Bilirubin Total: 0.5 mg/dL (ref 0.0–1.2)
CALCIUM: 9.6 mg/dL (ref 8.7–10.2)
CO2: 24 mmol/L (ref 20–29)
Chloride: 103 mmol/L (ref 96–106)
Creatinine, Ser: 0.96 mg/dL (ref 0.76–1.27)
GFR, EST AFRICAN AMERICAN: 106 mL/min/{1.73_m2} (ref >59)
GFR, EST NON AFRICAN AMERICAN: 92 mL/min/{1.73_m2} (ref >59)
GLOBULIN, TOTAL: 2.2 g/dL (ref 1.5–4.5)
Glucose: 97 mg/dL (ref 65–99)
POTASSIUM: 4.2 mmol/L (ref 3.5–5.2)
SODIUM: 140 mmol/L (ref 134–144)
Total Protein: 6.4 g/dL (ref 6.0–8.5)

## 2017-04-23 LAB — TSH: TSH: 5.03 u[IU]/mL — AB (ref 0.450–4.500)

## 2017-04-25 ENCOUNTER — Telehealth: Payer: Self-pay | Admitting: Family Medicine

## 2017-04-25 MED ORDER — DIAZEPAM 2 MG PO TABS: 2.0000 mg | ORAL_TABLET | Freq: Two times a day (BID) | ORAL | 0 refills | Status: DC | PRN

## 2017-04-25 NOTE — Assessment & Plan Note (Signed)
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 Will check levels today, continue fish oil and good diet and exercise.

## 2017-04-25 NOTE — Assessment & Plan Note (Signed)
BPs high/normal today but given significant sxs on medication with his training will continue d/c for now. May add back 5 mg amlodipine alone at next visit and monitor for hypotension. Check BMP today

## 2017-04-25 NOTE — Telephone Encounter (Signed)
Copied from [place] [id number]. Topic: Quick Communication - Rx Refill/Question >> Apr 25, 2017  6:19 PM Oliver Pila B wrote: Medication: diazepam (VALIUM) 2 MG tablet [[id number]]   Has the patient contacted their pharmacy? No.   (Agent: If no, request that the patient contact the pharmacy for the refill.)   Preferred Pharmacy (with phone number or street name): [facility]   Agent: Please be advised that RX refills may take up to 3 business days. We ask that you follow-up with your pharmacy.

## 2017-04-25 NOTE — Assessment & Plan Note (Signed)
Stable, continue paxil. Well controlled

## 2017-04-25 NOTE — Telephone Encounter (Signed)
Rx for very small supply of valium for his flight anxiety printed and ready to fax

## 2017-04-25 NOTE — Patient Instructions (Signed)
Follow up in 6 months 

## 2017-04-25 NOTE — Assessment & Plan Note (Signed)
Recheck levels today. No new sxs

## 2017-04-25 NOTE — Telephone Encounter (Signed)
Copied from [place]. Topic: Inquiry >> Apr 25, 2017  2:04 PM Margot Ables wrote: International event in [place]. Flying out this Friday. Pt has flight anxiety and is asking for something to take for the flight there and back to help relieve anxiety and maybe help him to sleep on the plane. Pt states it is a flight from [place] to [place], then [place] to [place] (19 hr layover), [place] to [place].   [id number] - [address], [place] [phone] (Phone) [phone] (Fax)

## 2017-04-26 NOTE — Telephone Encounter (Signed)
Duplicate message. Please see previous telephone call.

## 2017-07-09 ENCOUNTER — Emergency Department
Admission: EM | Admit: 2017-07-09 | Discharge: 2017-07-09 | Disposition: A | Discharge status: Home / Self Care | Payer: Managed Care, Other (non HMO) | Attending: Emergency Medicine | Admitting: Emergency Medicine

## 2017-07-09 ENCOUNTER — Emergency Department: Payer: Managed Care, Other (non HMO)

## 2017-07-09 ENCOUNTER — Other Ambulatory Visit: Payer: Self-pay

## 2017-07-09 ENCOUNTER — Encounter: Payer: Self-pay | Admitting: Emergency Medicine

## 2017-07-09 DIAGNOSIS — S6991XA Unspecified injury of right wrist, hand and finger(s), initial encounter: Secondary | ICD-10-CM | POA: Diagnosis present

## 2017-07-09 DIAGNOSIS — S61210A Laceration without foreign body of right index finger without damage to nail, initial encounter: Secondary | ICD-10-CM | POA: Insufficient documentation

## 2017-07-09 DIAGNOSIS — E039 Hypothyroidism, unspecified: Secondary | ICD-10-CM | POA: Insufficient documentation

## 2017-07-09 DIAGNOSIS — W228XXA Striking against or struck by other objects, initial encounter: Secondary | ICD-10-CM | POA: Insufficient documentation

## 2017-07-09 DIAGNOSIS — Z79899 Other long term (current) drug therapy: Secondary | ICD-10-CM | POA: Insufficient documentation

## 2017-07-09 DIAGNOSIS — S61212A Laceration without foreign body of right middle finger without damage to nail, initial encounter: Secondary | ICD-10-CM | POA: Diagnosis not present

## 2017-07-09 DIAGNOSIS — Y9389 Activity, other specified: Secondary | ICD-10-CM | POA: Diagnosis not present

## 2017-07-09 DIAGNOSIS — Z23 Encounter for immunization: Secondary | ICD-10-CM | POA: Insufficient documentation

## 2017-07-09 DIAGNOSIS — Y929 Unspecified place or not applicable: Secondary | ICD-10-CM | POA: Insufficient documentation

## 2017-07-09 DIAGNOSIS — Y999 Unspecified external cause status: Secondary | ICD-10-CM | POA: Diagnosis not present

## 2017-07-09 DIAGNOSIS — I10 Essential (primary) hypertension: Secondary | ICD-10-CM | POA: Diagnosis not present

## 2017-07-09 HISTORY — DX: Personal history of (healed) traumatic fracture: Z87.81

## 2017-07-09 MED ORDER — LIDOCAINE HCL (PF) 1 % IJ SOLN
INTRAMUSCULAR | Status: AC
  Filled 2017-07-09: qty 5

## 2017-07-09 MED ORDER — CEPHALEXIN 500 MG PO CAPS
ORAL_CAPSULE | ORAL | Status: AC
  Filled 2017-07-09: qty 1

## 2017-07-09 MED ORDER — TETANUS-DIPHTH-ACELL PERTUSSIS 5-2.5-18.5 LF-MCG/0.5 IM SUSP
INTRAMUSCULAR | Status: AC
  Administered 2017-07-09: 0.5 mL via INTRAMUSCULAR
  Filled 2017-07-09: qty 0.5

## 2017-07-09 MED ORDER — CEPHALEXIN 500 MG PO CAPS: 500.0000 mg | ORAL_CAPSULE | Freq: Three times a day (TID) | ORAL | 0 refills | Status: AC

## 2017-07-09 MED ORDER — TETANUS-DIPHTH-ACELL PERTUSSIS 5-2.5-18.5 LF-MCG/0.5 IM SUSP
0.5000 mL | Freq: Once | INTRAMUSCULAR | Status: AC
  Administered 2017-07-09: 0.5 mL via INTRAMUSCULAR

## 2017-07-09 MED ORDER — LIDOCAINE HCL 1 % IJ SOLN
5.0000 mL | Freq: Once | INTRAMUSCULAR | Status: AC
  Administered 2017-07-09: 5 mL
  Filled 2017-07-09: qty 5

## 2017-07-09 MED ORDER — CEPHALEXIN 500 MG PO CAPS
500.0000 mg | ORAL_CAPSULE | Freq: Once | ORAL | Status: AC
  Administered 2017-07-09: 500 mg via ORAL

## 2017-07-09 NOTE — ED Notes (Signed)
Pt reports using table saw today and wood bounced off saw and hit his right hand. +lac to fingers, bleeding controlled. Unk when last tetanus.

## 2017-07-09 NOTE — ED Notes (Signed)
Pt was discharged and went home then returned because he stated he thought he was suppose to get a tetanus shot - discussed with Provider Sherral Hammers [facility] and she will order tetanus shot and pt will be brought to room for administration

## 2017-07-09 NOTE — ED Triage Notes (Signed)
Pt to ED via POV, pt states that he was cutting wood and the wood came back and hit his index and middle finger and cut them. Bleeding is controlled at this time. Pt has bandages that were applied when he arrived to the ED. Pt is in NAD at this time.
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
## 2017-10-04 ENCOUNTER — Ambulatory Visit: Payer: Self-pay

## 2017-10-04 NOTE — Telephone Encounter (Signed)
Pt. Reports episodes of dizziness x 1 year, but getting worse recently. More pronounced with exercise. Pt. Is training for an Iron [employer] and has had significant dizziness lasting all day at times. Has felt like he has been well hydrated. Near syncope at times. Denies chest pain or shortness of breath. No dizziness today. Appointment made for tomorrow morning. No availability with Dr. Jeananne Rama.  Reason for Disposition . [1] MODERATE dizziness (e.g., interferes with normal activities) AND [2] has NOT been evaluated by physician for this  (Exception: dizziness caused by heat exposure, sudden standing, or poor fluid intake)  Answer Assessment - Initial Assessment Questions 1. DESCRIPTION: "Describe your dizziness."     Dizzy - faint 2. LIGHTHEADED: "Do you feel lightheaded?" (e.g., somewhat faint, woozy, weak upon standing)     Woozy 3. VERTIGO: "Do you feel like either you or the room is spinning or tilting?" (i.e. vertigo)     No 4. SEVERITY: "How bad is it?"  "Do you feel like you are going to faint?" "Can you stand and walk?"   - MILD - walking normally   - MODERATE - interferes with normal activities (e.g., work, school)    - SEVERE - unable to stand, requires support to walk, feels like passing out now.      None today 5. ONSET:  "When did the dizziness begin?"     On and off x 1 year but is getting 6. AGGRAVATING FACTORS: "Does anything make it worse?" (e.g., standing, change in head position)     Seems worse after exercise 7. HEART RATE: "Can you tell me your heart rate?" "How many beats in 15 seconds?"  (Note: not all patients can do this)       Fine - no problems 8. CAUSE: "What do you think is causing the dizziness?"     Unsure 9. RECURRENT SYMPTOM: "Have you had dizziness before?" If so, ask: "When was the last time?" "What happened that time?"     This weekend 10. OTHER SYMPTOMS: "Do you have any other symptoms?" (e.g., fever, chest pain, vomiting, diarrhea, bleeding)       No 11.  PREGNANCY: "Is there any chance you are pregnant?" "When was your last menstrual period?"       n/a  Protocols used: DIZZINESS [facility]

## 2017-10-05 ENCOUNTER — Ambulatory Visit (INDEPENDENT_AMBULATORY_CARE_PROVIDER_SITE_OTHER): Payer: Managed Care, Other (non HMO) | Admitting: Physician Assistant

## 2017-10-05 ENCOUNTER — Encounter: Payer: Self-pay | Admitting: Physician Assistant

## 2017-10-05 ENCOUNTER — Other Ambulatory Visit: Payer: Self-pay

## 2017-10-05 VITALS — BP 131/88 | HR 49 | Temp 97.9°F | Ht 73.0 in | Wt 244.1 lb

## 2017-10-05 DIAGNOSIS — Z23 Encounter for immunization: Secondary | ICD-10-CM | POA: Diagnosis not present

## 2017-10-05 DIAGNOSIS — Z1211 Encounter for screening for malignant neoplasm of colon: Secondary | ICD-10-CM | POA: Diagnosis not present

## 2017-10-05 DIAGNOSIS — I10 Essential (primary) hypertension: Secondary | ICD-10-CM | POA: Diagnosis not present

## 2017-10-05 DIAGNOSIS — S060X0S Concussion without loss of consciousness, sequela: Secondary | ICD-10-CM

## 2017-10-05 DIAGNOSIS — R42 Dizziness and giddiness: Secondary | ICD-10-CM

## 2017-10-05 NOTE — Patient Instructions (Signed)
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
## 2017-10-07 ENCOUNTER — Other Ambulatory Visit: Payer: Self-pay | Admitting: Physician Assistant

## 2017-10-08 LAB — MAGNESIUM: Magnesium: 1.9 mg/dL (ref 1.6–2.3)

## 2017-10-08 LAB — CBC WITH DIFFERENTIAL/PLATELET
Basophils Absolute: 0 10*3/uL (ref 0.0–0.2)
Basos: 1 %
EOS (ABSOLUTE): 0.2 10*3/uL (ref 0.0–0.4)
Eos: 4 %
Hematocrit: 44.2 % (ref 37.5–51.0)
Hemoglobin: 14.7 g/dL (ref 13.0–17.7)
Immature Grans (Abs): 0 10*3/uL (ref 0.0–0.1)
Immature Granulocytes: 0 %
Lymphocytes Absolute: 1.7 10*3/uL (ref 0.7–3.1)
Lymphs: 32 %
MCH: 31.1 pg (ref 26.6–33.0)
MCHC: 33.3 g/dL (ref 31.5–35.7)
MCV: 93 fL (ref 79–97)
Monocytes Absolute: 0.5 10*3/uL (ref 0.1–0.9)
Monocytes: 9 %
Neutrophils Absolute: 3 10*3/uL (ref 1.4–7.0)
Neutrophils: 54 %
Platelets: 210 10*3/uL (ref 150–450)
RBC: 4.73 x10E6/uL (ref 4.14–5.80)
RDW: 12.1 % — ABNORMAL LOW (ref 12.3–15.4)
WBC: 5.4 10*3/uL (ref 3.4–10.8)

## 2017-10-08 LAB — COMPREHENSIVE METABOLIC PANEL
ALT: 20 IU/L (ref 0–44)
AST: 23 IU/L (ref 0–40)
Albumin/Globulin Ratio: 1.7 (ref 1.2–2.2)
Albumin: 4.7 g/dL (ref 3.5–5.5)
Alkaline Phosphatase: 74 IU/L (ref 39–117)
BUN/Creatinine Ratio: 16 (ref 9–20)
BUN: 15 mg/dL (ref 6–24)
Bilirubin Total: 0.4 mg/dL (ref 0.0–1.2)
CO2: 19 mmol/L — ABNORMAL LOW (ref 20–29)
Calcium: 9.5 mg/dL (ref 8.7–10.2)
Chloride: 99 mmol/L (ref 96–106)
Creatinine, Ser: 0.96 mg/dL (ref 0.76–1.27)
GFR calc Af Amer: 105 mL/min/{1.73_m2} (ref >59)
GFR calc non Af Amer: 91 mL/min/{1.73_m2} (ref >59)
Globulin, Total: 2.7 g/dL (ref 1.5–4.5)
Glucose: 83 mg/dL (ref 65–99)
Potassium: 4.3 mmol/L (ref 3.5–5.2)
Sodium: 139 mmol/L (ref 134–144)
Total Protein: 7.4 g/dL (ref 6.0–8.5)

## 2017-10-08 LAB — URINALYSIS
Bilirubin, UA: NEGATIVE
Glucose, UA: NEGATIVE
Ketones, UA: NEGATIVE
Leukocytes, UA: NEGATIVE
Nitrite, UA: NEGATIVE
Protein, UA: NEGATIVE
RBC, UA: NEGATIVE
Specific Gravity, UA: 1.009 (ref 1.005–1.030)
Urobilinogen, Ur: 0.2 mg/dL (ref 0.2–1.0)
pH, UA: 7 (ref 5.0–7.5)

## 2017-10-08 LAB — IRON,TIBC AND FERRITIN PANEL
Ferritin: 102 ng/mL (ref 30–400)
Iron Saturation: 27 % (ref 15–55)
Iron: 86 ug/dL (ref 38–169)
Total Iron Binding Capacity: 318 ug/dL (ref 250–450)
UIBC: 232 ug/dL (ref 111–343)

## 2017-10-08 LAB — TSH+T4F+T3FREE
Free T4: 1.1 ng/dL (ref 0.82–1.77)
T3, Free: 2.7 pg/mL (ref 2.0–4.4)
TSH: 3.9 u[IU]/mL (ref 0.450–4.500)

## 2017-10-08 LAB — HGB A1C W/O EAG: Hgb A1c MFr Bld: 5.2 % (ref 4.8–5.6)

## 2017-10-08 LAB — VITAMIN D 25 HYDROXY (VIT D DEFICIENCY, FRACTURES): Vit D, 25-Hydroxy: 26 ng/mL — ABNORMAL LOW (ref 30.0–100.0)

## 2017-10-10 ENCOUNTER — Telehealth: Payer: Self-pay

## 2017-10-10 NOTE — Telephone Encounter (Signed)
Returned patients call to schedule colonoscopy.  Thanks [employer]

## 2017-10-11 ENCOUNTER — Other Ambulatory Visit: Payer: Self-pay

## 2017-10-11 DIAGNOSIS — Z1211 Encounter for screening for malignant neoplasm of colon: Secondary | ICD-10-CM

## 2017-10-11 MED ORDER — NA SULFATE-K SULFATE-MG SULF 17.5-3.13-1.6 GM/177ML PO SOLN: 1.0000 | Freq: Once | ORAL | 0 refills | Status: AC

## 2017-10-11 NOTE — Progress Notes (Signed)
Instructions for Colonoscopy will be sent via email.  Prescription for SuPrep has been sent to [facility].  Thanks [employer]

## 2017-10-18 ENCOUNTER — Ambulatory Visit: Payer: Managed Care, Other (non HMO) | Admitting: Anesthesiology

## 2017-10-18 ENCOUNTER — Encounter: Payer: Self-pay | Admitting: *Deleted

## 2017-10-18 ENCOUNTER — Ambulatory Visit
Admission: RE | Admit: 2017-10-18 | Discharge: 2017-10-18 | Disposition: A | Discharge status: Home / Self Care | Payer: Managed Care, Other (non HMO) | Source: Ambulatory Visit | Attending: Gastroenterology | Admitting: Gastroenterology

## 2017-10-18 ENCOUNTER — Encounter
Admission: RE | Disposition: A | Discharge status: Home / Self Care | Payer: Self-pay | Source: Ambulatory Visit | Attending: Gastroenterology

## 2017-10-18 DIAGNOSIS — Z79899 Other long term (current) drug therapy: Secondary | ICD-10-CM | POA: Insufficient documentation

## 2017-10-18 DIAGNOSIS — I10 Essential (primary) hypertension: Secondary | ICD-10-CM | POA: Insufficient documentation

## 2017-10-18 DIAGNOSIS — K621 Rectal polyp: Secondary | ICD-10-CM | POA: Diagnosis not present

## 2017-10-18 DIAGNOSIS — Z1211 Encounter for screening for malignant neoplasm of colon: Secondary | ICD-10-CM | POA: Diagnosis present

## 2017-10-18 DIAGNOSIS — E785 Hyperlipidemia, unspecified: Secondary | ICD-10-CM | POA: Diagnosis not present

## 2017-10-18 DIAGNOSIS — D125 Benign neoplasm of sigmoid colon: Secondary | ICD-10-CM | POA: Diagnosis not present

## 2017-10-18 DIAGNOSIS — D120 Benign neoplasm of cecum: Secondary | ICD-10-CM | POA: Diagnosis not present

## 2017-10-18 HISTORY — PX: COLONOSCOPY WITH PROPOFOL: SHX5780

## 2017-10-18 SURGERY — COLONOSCOPY WITH PROPOFOL
Anesthesia: General

## 2017-10-18 MED ORDER — PROPOFOL 10 MG/ML IV BOLUS
INTRAVENOUS | Status: DC | PRN
  Administered 2017-10-18: 100 mg via INTRAVENOUS

## 2017-10-18 MED ORDER — LIDOCAINE HCL (CARDIAC) PF 100 MG/5ML IV SOSY
PREFILLED_SYRINGE | INTRAVENOUS | Status: DC | PRN
  Administered 2017-10-18: 100 mg via INTRAVENOUS

## 2017-10-18 MED ORDER — SODIUM CHLORIDE 0.9 % IV SOLN
INTRAVENOUS | Status: DC
  Administered 2017-10-18: 1000 mL via INTRAVENOUS

## 2017-10-18 MED ORDER — LIDOCAINE HCL (PF) 2 % IJ SOLN
INTRAMUSCULAR | Status: AC
  Filled 2017-10-18: qty 10

## 2017-10-18 MED ORDER — PROPOFOL 500 MG/50ML IV EMUL
INTRAVENOUS | Status: AC
  Filled 2017-10-18: qty 50

## 2017-10-18 MED ORDER — PROPOFOL 500 MG/50ML IV EMUL
INTRAVENOUS | Status: DC | PRN
  Administered 2017-10-18: 150 ug/kg/min via INTRAVENOUS

## 2017-10-18 NOTE — Anesthesia Preprocedure Evaluation (Signed)
Anesthesia Evaluation  Patient identified by MRN, date of birth, ID band Patient awake    Reviewed: Allergy & Precautions, H&P , NPO status , Patient's Chart, lab work & pertinent test results, reviewed documented beta blocker date and time   History of Anesthesia Complications Negative for: history of anesthetic complications  Airway Mallampati: III  TM Distance: >3 FB Neck ROM: full    Dental  (+) Caps, Dental Advidsory Given, Missing   Pulmonary neg pulmonary ROS,           Cardiovascular Exercise Tolerance: Good hypertension, (-) angina(-) CAD, (-) Past MI, (-) Cardiac Stents and (-) CABG (-) dysrhythmias (-) Valvular Problems/Murmurs     Neuro/Psych neg Seizures PSYCHIATRIC DISORDERS Depression Multiple concussions    GI/Hepatic Neg liver ROS, GERD  ,  Endo/Other  negative endocrine ROS  Renal/GU negative Renal ROS  negative genitourinary   Musculoskeletal   Abdominal   Peds  Hematology negative hematology ROS (+)   Anesthesia Other Findings Past Medical History: No date: History of multiple concussions     Comment:  x3 No date: History of spinal fracture No date: Hyperlipidemia No date: Hypertension   Reproductive/Obstetrics negative OB ROS                             Anesthesia Physical Anesthesia Plan  ASA: II  Anesthesia Plan: General   Post-op Pain Management:    Induction: Intravenous  PONV Risk Score and Plan: 2 and Propofol infusion and TIVA  Airway Management Planned: Nasal Cannula  Additional Equipment:   Intra-op Plan:   Post-operative Plan:   Informed Consent: I have reviewed the patients History and Physical, chart, labs and discussed the procedure including the risks, benefits and alternatives for the proposed anesthesia with the patient or authorized representative who has indicated his/her understanding and acceptance.   Dental Advisory  Given  Plan Discussed with: Anesthesiologist, CRNA and Surgeon  Anesthesia Plan Comments:         Anesthesia Quick Evaluation

## 2017-10-18 NOTE — Anesthesia Postprocedure Evaluation (Signed)
Anesthesia Post Note  Patient: Anthony Hunter  Procedure(s) Performed: COLONOSCOPY WITH PROPOFOL (N/A )  Patient location during evaluation: Endoscopy Anesthesia Type: General Level of consciousness: awake and alert Pain management: pain level controlled Vital Signs Assessment: post-procedure vital signs reviewed and stable Respiratory status: spontaneous breathing, nonlabored ventilation, respiratory function stable and patient connected to nasal cannula oxygen Cardiovascular status: blood pressure returned to baseline and stable Postop Assessment: no apparent nausea or vomiting Anesthetic complications: no     Last Vitals:  Vitals:   10/18/17 1029 10/18/17 1039  BP: 124/77 122/87  Pulse: (!) 49 (!) 53  Resp: 18 17  Temp:    SpO2: 98% 100%    Last Pain:  Vitals:   10/18/17 1039  TempSrc:   PainSc: 0-No pain                 Martha Clan
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
## 2017-10-18 NOTE — Transfer of Care (Signed)
Immediate Anesthesia Transfer of Care Note  Patient: Anthony Hunter  Procedure(s) Performed: COLONOSCOPY WITH PROPOFOL (N/A )  Patient Location: Endoscopy Unit  Anesthesia Type:General  Level of Consciousness: sedated  Airway & Oxygen Therapy: Patient Spontanous Breathing and Patient connected to nasal cannula oxygen  Post-op Assessment: Report given to RN and Post -op Vital signs reviewed and stable  Post vital signs: Reviewed and stable  Last Vitals:  Vitals Value Taken Time  BP 122/68 10/18/2017 10:09 AM  Temp    Pulse 73 10/18/2017 10:09 AM  Resp 13 10/18/2017 10:09 AM  SpO2 95 % 10/18/2017 10:09 AM  Vitals shown include unvalidated device data.  Last Pain:  Vitals:   10/18/17 0818  PainSc: 0-No pain         Complications: No apparent anesthesia complications

## 2017-10-18 NOTE — Anesthesia Post-op Follow-up Note (Signed)
Anesthesia QCDR form completed.        

## 2017-10-18 NOTE — Op Note (Signed)
[facility] Patient Name: Anthony Hunter Procedure Date: 10/18/2017 8:13 AM MRN: [id number] Account #: [id number] Date of Birth: 1966/01/19 Admit Type: Outpatient Age: 52 Room: [facility] ENDO ROOM 1 Gender: Male Note Status: Finalized Procedure:            Colonoscopy Indications:          Screening for colorectal malignant neoplasm Providers:            Jonathon Bellows MD, MD Referring MD:         Guadalupe Maple, MD (Referring MD) Medicines:            Monitored Anesthesia Care Complications:        No immediate complications. Procedure:            Pre-Anesthesia Assessment:                       - Prior to the procedure, a History and Physical was                        performed, and patient medications, allergies and                        sensitivities were reviewed. The patient's tolerance of                        previous anesthesia was reviewed.                       - The risks and benefits of the procedure and the                        sedation options and risks were discussed with the                        patient. All questions were answered and informed                        consent was obtained.                       - ASA Grade Assessment: II - A patient with mild                        systemic disease.                       After obtaining informed consent, the colonoscope was                        passed under direct vision. Throughout the procedure,                        the patient's blood pressure, pulse, and oxygen                        saturations were monitored continuously. The                        Colonoscope was introduced through the anus and  advanced to the the cecum, identified by the                        appendiceal orifice, IC valve and transillumination.                        The colonoscopy was performed with ease. The patient                        tolerated the procedure well. The quality  of the bowel                        preparation was good. Findings:      The perianal and digital rectal examinations were normal.      Two sessile polyps were found in the rectum and cecum. The polyps were 4       to 5 mm in size. These polyps were removed with a cold snare. Resection       and retrieval were complete.      A 7 mm polyp was found in the sigmoid colon. The polyp was sessile. The       polyp was removed with a cold snare. Resection and retrieval were       complete. To prevent bleeding after the polypectomy, one hemostatic clip       was successfully placed. There was no bleeding at the end of the       maneuver.      The exam was otherwise without abnormality on direct and retroflexion       views. Impression:           - Two 4 to 5 mm polyps in the rectum and in the cecum,                        removed with a cold snare. Resected and retrieved.                       - One 7 mm polyp in the sigmoid colon, removed with a                        cold snare. Resected and retrieved. Clip was placed.                       - The examination was otherwise normal on direct and                        retroflexion views. Recommendation:       - Discharge patient to home (with escort).                       - Resume previous diet.                       - Continue present medications.                       - Await pathology results.                       - Repeat colonoscopy in 3 - 5 years for surveillance  based on pathology results. Procedure Code(s):    --- Professional ---                       [phone], Colonoscopy, flexible; with removal of tumor(s),                        polyp(s), or other lesion(s) by snare technique Diagnosis Code(s):    --- Professional ---                       Z12.11, Encounter for screening for malignant neoplasm                        of colon                       K62.1, Rectal polyp                       D12.0, Benign neoplasm  of cecum                       D12.5, Benign neoplasm of sigmoid colon CPT copyright 2017 [employer]. All rights reserved. The codes documented in this report are preliminary and upon coder review may  be revised to meet current compliance requirements. Jonathon Bellows, MD Jonathon Bellows MD, MD 10/18/2017 10:06:10 AM This report has been signed electronically. Number of Addenda: 0 Note Initiated On: 10/18/2017 8:13 AM Scope Withdrawal Time: 0 hours 21 minutes 14 seconds  Total Procedure Duration: 0 hours 26 minutes 23 seconds       [facility]

## 2017-10-19 LAB — SURGICAL PATHOLOGY

## 2017-10-20 ENCOUNTER — Encounter: Payer: Self-pay | Admitting: Gastroenterology

## 2017-10-24 ENCOUNTER — Telehealth: Payer: Self-pay | Admitting: Family Medicine

## 2017-10-24 DIAGNOSIS — R42 Dizziness and giddiness: Secondary | ICD-10-CM

## 2017-10-24 NOTE — Telephone Encounter (Signed)
I didn't see the patient, but I will generate a referral - please let him know that it's in
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
## 2017-10-24 NOTE — Telephone Encounter (Signed)
LVM to let pt know that the referral has been put in.

## 2017-10-26 ENCOUNTER — Other Ambulatory Visit: Payer: Self-pay | Admitting: Family Medicine

## 2017-10-26 DIAGNOSIS — F3342 Major depressive disorder, recurrent, in full remission: Secondary | ICD-10-CM

## 2017-10-26 NOTE — Telephone Encounter (Signed)
Patient is up to date with appointments- has follow up scheduled 11/04/17 as directed. Refill granted #[phone] with no additional

## 2017-10-28 ENCOUNTER — Encounter: Payer: Managed Care, Other (non HMO) | Admitting: Family Medicine

## 2017-11-03 ENCOUNTER — Other Ambulatory Visit: Payer: Self-pay | Admitting: Family Medicine

## 2017-11-03 DIAGNOSIS — F0781 Postconcussional syndrome: Secondary | ICD-10-CM | POA: Insufficient documentation

## 2017-11-03 DIAGNOSIS — R42 Dizziness and giddiness: Secondary | ICD-10-CM | POA: Insufficient documentation

## 2017-11-03 DIAGNOSIS — I10 Essential (primary) hypertension: Secondary | ICD-10-CM

## 2017-11-03 DIAGNOSIS — F3342 Major depressive disorder, recurrent, in full remission: Secondary | ICD-10-CM

## 2017-11-03 NOTE — Telephone Encounter (Signed)
Refill request for Paxil. Pt has appt with Wynona Dove on 11/04/17 PCP: Dr. Jeananne Rama

## 2017-11-04 ENCOUNTER — Other Ambulatory Visit: Payer: Self-pay

## 2017-11-04 ENCOUNTER — Ambulatory Visit (INDEPENDENT_AMBULATORY_CARE_PROVIDER_SITE_OTHER): Payer: Managed Care, Other (non HMO) | Admitting: Family Medicine

## 2017-11-04 ENCOUNTER — Encounter: Payer: Self-pay | Admitting: Family Medicine

## 2017-11-04 VITALS — BP 138/82 | HR 54 | Temp 97.8°F | Ht 73.0 in | Wt 243.3 lb

## 2017-11-04 DIAGNOSIS — Z125 Encounter for screening for malignant neoplasm of prostate: Secondary | ICD-10-CM

## 2017-11-04 DIAGNOSIS — E782 Mixed hyperlipidemia: Secondary | ICD-10-CM | POA: Diagnosis not present

## 2017-11-04 DIAGNOSIS — I10 Essential (primary) hypertension: Secondary | ICD-10-CM

## 2017-11-04 DIAGNOSIS — Z0000 Encounter for general adult medical examination without abnormal findings: Secondary | ICD-10-CM | POA: Diagnosis not present

## 2017-11-04 DIAGNOSIS — Z8639 Personal history of other endocrine, nutritional and metabolic disease: Secondary | ICD-10-CM | POA: Diagnosis not present

## 2017-11-04 DIAGNOSIS — F3342 Major depressive disorder, recurrent, in full remission: Secondary | ICD-10-CM | POA: Diagnosis not present

## 2017-11-04 LAB — UA/M W/RFLX CULTURE, ROUTINE
BILIRUBIN UA: NEGATIVE
Glucose, UA: NEGATIVE
Ketones, UA: NEGATIVE
Leukocytes, UA: NEGATIVE
Nitrite, UA: NEGATIVE
PH UA: 6 (ref 5.0–7.5)
Protein, UA: NEGATIVE
RBC, UA: NEGATIVE
Specific Gravity, UA: <1.005 — ABNORMAL LOW (ref 1.005–1.030)
UUROB: 0.2 mg/dL (ref 0.2–1.0)

## 2017-11-04 MED ORDER — PAROXETINE HCL 20 MG PO TABS: ORAL_TABLET | ORAL | 3 refills | Status: DC

## 2017-11-04 MED ORDER — BENAZEPRIL HCL 40 MG PO TABS: 40.0000 mg | ORAL_TABLET | Freq: Every day | ORAL | 3 refills | Status: DC

## 2017-11-04 MED ORDER — AMLODIPINE BESYLATE 10 MG PO TABS: 10.0000 mg | ORAL_TABLET | Freq: Every day | ORAL | 3 refills | Status: DC

## 2017-11-04 NOTE — Telephone Encounter (Signed)
Pt with OV today.
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
## 2017-11-04 NOTE — Patient Instructions (Signed)
Follow up in 6 months 

## 2017-11-04 NOTE — Telephone Encounter (Signed)
You're seeing him today

## 2017-11-05 LAB — COMPREHENSIVE METABOLIC PANEL
ALBUMIN: 4.5 g/dL (ref 3.5–5.5)
ALK PHOS: 85 IU/L (ref 39–117)
ALT: 21 IU/L (ref 0–44)
AST: 21 IU/L (ref 0–40)
Albumin/Globulin Ratio: 1.9 (ref 1.2–2.2)
BUN / CREAT RATIO: 18 (ref 9–20)
BUN: 15 mg/dL (ref 6–24)
Bilirubin Total: 0.5 mg/dL (ref 0.0–1.2)
CO2: 24 mmol/L (ref 20–29)
Calcium: 9.7 mg/dL (ref 8.7–10.2)
Chloride: 104 mmol/L (ref 96–106)
Creatinine, Ser: 0.85 mg/dL (ref 0.76–1.27)
GFR calc Af Amer: 117 mL/min/{1.73_m2} (ref >59)
GFR calc non Af Amer: 101 mL/min/{1.73_m2} (ref >59)
GLOBULIN, TOTAL: 2.4 g/dL (ref 1.5–4.5)
GLUCOSE: 77 mg/dL (ref 65–99)
Potassium: 4.2 mmol/L (ref 3.5–5.2)
SODIUM: 142 mmol/L (ref 134–144)
Total Protein: 6.9 g/dL (ref 6.0–8.5)

## 2017-11-05 LAB — CBC WITH DIFFERENTIAL/PLATELET
BASOS ABS: 0 10*3/uL (ref 0.0–0.2)
Basos: 1 %
EOS (ABSOLUTE): 0.2 10*3/uL (ref 0.0–0.4)
Eos: 4 %
Hematocrit: 43.3 % (ref 37.5–51.0)
Hemoglobin: 14.7 g/dL (ref 13.0–17.7)
IMMATURE GRANS (ABS): 0 10*3/uL (ref 0.0–0.1)
Immature Granulocytes: 0 %
LYMPHS: 30 %
Lymphocytes Absolute: 1.7 10*3/uL (ref 0.7–3.1)
MCH: 31.1 pg (ref 26.6–33.0)
MCHC: 33.9 g/dL (ref 31.5–35.7)
MCV: 92 fL (ref 79–97)
MONOCYTES: 7 %
Monocytes Absolute: 0.4 10*3/uL (ref 0.1–0.9)
NEUTROS PCT: 58 %
Neutrophils Absolute: 3.3 10*3/uL (ref 1.4–7.0)
PLATELETS: 218 10*3/uL (ref 150–450)
RBC: 4.72 x10E6/uL (ref 4.14–5.80)
RDW: 12.3 % (ref 12.3–15.4)
WBC: 5.6 10*3/uL (ref 3.4–10.8)

## 2017-11-05 LAB — LIPID PANEL W/O CHOL/HDL RATIO
CHOLESTEROL TOTAL: 242 mg/dL — AB (ref 100–199)
HDL: 55 mg/dL (ref >39)
LDL Calculated: 108 mg/dL — ABNORMAL HIGH (ref 0–99)
Triglycerides: 394 mg/dL — ABNORMAL HIGH (ref 0–149)
VLDL Cholesterol Cal: 79 mg/dL — ABNORMAL HIGH (ref 5–40)

## 2017-11-05 LAB — TSH: TSH: 3.43 u[IU]/mL (ref 0.450–4.500)

## 2017-11-05 LAB — PSA: PROSTATE SPECIFIC AG, SERUM: 1.1 ng/mL (ref 0.0–4.0)

## 2017-11-07 ENCOUNTER — Encounter: Payer: Self-pay | Admitting: Family Medicine

## 2017-11-08 NOTE — Assessment & Plan Note (Signed)
Will check TSH, treat if needed. Asymptomatic

## 2017-11-08 NOTE — Assessment & Plan Note (Signed)
Stable and under good control with paxil, continue current regimen

## 2017-11-08 NOTE — Assessment & Plan Note (Signed)
Recheck lipids, adjust as needed 

## 2017-11-08 NOTE — Assessment & Plan Note (Signed)
Stable and WNL, continue current regimen 

## 2018-01-27 ENCOUNTER — Other Ambulatory Visit: Payer: Self-pay | Admitting: Family Medicine

## 2018-04-26 ENCOUNTER — Ambulatory Visit: Payer: Self-pay

## 2018-04-26 NOTE — Telephone Encounter (Signed)
Pt can come in once we get tests or go to health dept. Pt agreeable per Christan to calling health dept

## 2018-04-26 NOTE — Telephone Encounter (Signed)
Patient called and says that he had the flu 8 weeks ago-coughing, fever-lasting for 2 weeks. He says after getting over that, the symptoms came back, he went to [facility] and was diagnosed with bronchitis. He says he still has the dry, wet cough, coughing up yellow, green and sometimes clear phlegm. He checked is fever and it was 97.5. He says he hasn't run a fever since he had bronchitis. 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 He says he was in [place] last week. He says he has a sore throat, wheezing when he takes a deep breath, sneezing, and still coughing a few times each hour today, but not that bad. According to protocol, see PCP within 3 days. He says that he just want a referral to go to a lab to get tested for Coronavirus. He says his wife is starting to experience the same symptoms and he wants to make sure he is not carrying the Coronavirus. I called to the office and spoke to [place], [facility] who says Apolonio Schneiders will see him in the office, but will need to make sure they have what's needed to test him. I advised someone from the office will call him back with the appointment, he verbalized understanding.  Reason for Disposition . Cough has been present for > 3 weeks  Answer Assessment - Initial Assessment Questions 1. ONSET: "When did the cough begin?"      8 weeks ago 2. SEVERITY: "How bad is the cough today?"      Not terrible; cough a few times every hour 3. RESPIRATORY DISTRESS: "Describe your breathing."      Wheezing 4. FEVER: "Do you have a fever?" If so, ask: "What is your temperature, how was it measured, and when did it start?"     No-97.5 5. SPUTUM: "Describe the color of your sputum" (clear, white, yellow, green)     Yellow, green, sometimes clear 6. HEMOPTYSIS: "Are you coughing up any blood?" If so ask: "How much?" (flecks, streaks, tablespoons, etc.)     No 7. CARDIAC HISTORY: "Do you have any  history of heart disease?" (e.g., heart attack, congestive heart failure)      No 8. LUNG HISTORY: "Do you have any history of lung disease?"  (e.g., pulmonary embolus, asthma, emphysema)     Exercise induced asthma sparatic 9. PE RISK FACTORS: "Do you have a history of blood clots?" (or: recent major surgery, recent prolonged travel, bedridden)     Yes recent prolonged travel many times over past 8 weeks 10. OTHER SYMPTOMS: "Do you have any other symptoms?" (e.g., runny nose, wheezing, chest pain)       Wheezing when take a deep breath, sore throat, sneezing 11. PREGNANCY: "Is there any chance you are pregnant?" "When was your last menstrual period?"       N/A 12. TRAVEL: "Have you traveled out of the country in the last month?" (e.g., travel history, exposures)       Not out of the country, but on airplane for several [place] state visits, hotel visits  Protocols used: [place]

## 2018-05-05 ENCOUNTER — Encounter: Payer: Self-pay | Admitting: Family Medicine

## 2018-05-05 ENCOUNTER — Other Ambulatory Visit: Payer: Self-pay

## 2018-05-05 ENCOUNTER — Ambulatory Visit: Payer: Managed Care, Other (non HMO) | Admitting: Family Medicine

## 2018-05-05 VITALS — BP 124/85 | HR 58 | Temp 97.7°F

## 2018-05-05 DIAGNOSIS — I10 Essential (primary) hypertension: Secondary | ICD-10-CM

## 2018-05-05 DIAGNOSIS — F3342 Major depressive disorder, recurrent, in full remission: Secondary | ICD-10-CM | POA: Diagnosis not present

## 2018-05-05 DIAGNOSIS — E782 Mixed hyperlipidemia: Secondary | ICD-10-CM

## 2018-05-05 NOTE — Progress Notes (Signed)
BP 124/85   Pulse (!) 58   Temp 97.7 F (36.5 C) (Oral)   SpO2 97%    Subjective:    Patient ID: Anthony Hunter, male    DOB: 10/16/1965, 53 y.o.   MRN: [id number]  HPI: Anthony Hunter is a 53 y.o. male  Chief Complaint  Patient presents with  . Depression  . Hypertension   Here today for 6 month f/u. No concerns today  HTN - BPs 120s-130s/80s when checked at the drugstore. Taking amlodipine and benazepril faithfully without side effects. Denies CP, SOB, HAs, dizziness. Still exercising regularly training for events and eating well.   Taking fish oil supplements for HLD. Wanting to avoid cholesterol medicines and continue working on diet and exercise for control. Denies CP, claudication, SOB.   Paxil working well still for moods. No side effects, SI/HI, sleep or appetite issues. Has been stable for years on this regimen.   Depression screen [facility] 2/9 05/05/2018 11/04/2017 10/05/2017  Decreased Interest 0 0 0  Down, Depressed, Hopeless 0 0 0  PHQ - 2 Score 0 0 0  Altered sleeping 0 1 1  Tired, decreased energy 0 1 1  Change in appetite 0 0 0  Feeling bad or failure about yourself  0 0 0  Trouble concentrating 0 0 0  Moving slowly or fidgety/restless 0 0 0  Suicidal thoughts 0 0 0  PHQ-9 Score 0 2 2  Difficult doing work/chores Not difficult at all - -    Relevant past medical, surgical, family and social history reviewed and updated as indicated. Interim medical history since our last visit reviewed. Allergies and medications reviewed and updated.  Review of Systems  Per HPI unless specifically indicated above     Objective:    BP 124/85   Pulse (!) 58   Temp 97.7 F (36.5 C) (Oral)   SpO2 97%   Wt Readings from Last 3 Encounters:  11/04/17 243 lb 4.8 oz (110.4 kg)  10/18/17 240 lb (108.9 kg)  10/05/17 244 lb 1.6 oz (110.7 kg)    Physical Exam Vitals signs and nursing note reviewed.  Constitutional:      Appearance: Normal appearance.  HENT:     Head: Atraumatic.   Eyes:     Extraocular Movements: Extraocular movements intact.     Conjunctiva/sclera: Conjunctivae normal.  Neck:     Musculoskeletal: Normal range of motion and neck supple.  Cardiovascular:     Rate and Rhythm: Normal rate and regular rhythm.  Pulmonary:     Effort: Pulmonary effort is normal.     Breath sounds: Normal breath sounds.  Musculoskeletal: Normal range of motion.  Skin:    General: Skin is warm and dry.  Neurological:     General: No focal deficit present.     Mental Status: He is oriented to person, place, and time.  Psychiatric:        Mood and Affect: Mood normal.        Thought Content: Thought content normal.        Judgment: Judgment normal.     Results for orders placed or performed in visit on 11/04/17  CBC with Differential/Platelet  Result Value Ref Range   WBC 5.6 3.4 - 10.8 x10E3/uL   RBC 4.72 4.14 - 5.80 x10E6/uL   Hemoglobin 14.7 13.0 - 17.7 g/dL   Hematocrit 43.3 37.5 - 51.0 %   MCV 92 79 - 97 fL   MCH 31.1 26.6 - 33.0 pg  MCHC 33.9 31.5 - 35.7 g/dL   RDW 12.3 12.3 - 15.4 %   Platelets 218 150 - 450 x10E3/uL   Neutrophils 58 Not Estab. %   Lymphs 30 Not Estab. %   Monocytes 7 Not Estab. %   Eos 4 Not Estab. %   Basos 1 Not Estab. %   Neutrophils Absolute 3.3 1.4 - 7.0 x10E3/uL   Lymphocytes Absolute 1.7 0.7 - 3.1 x10E3/uL   Monocytes Absolute 0.4 0.1 - 0.9 x10E3/uL   EOS (ABSOLUTE) 0.2 0.0 - 0.4 x10E3/uL   Basophils Absolute 0.0 0.0 - 0.2 x10E3/uL   Immature Granulocytes 0 Not Estab. %   Immature Grans (Abs) 0.0 0.0 - 0.1 x10E3/uL  Comprehensive metabolic panel  Result Value Ref Range   Glucose 77 65 - 99 mg/dL   BUN 15 6 - 24 mg/dL   Creatinine, Ser 0.85 0.76 - 1.27 mg/dL   GFR calc non Af Amer 101 >59 mL/min/1.73   GFR calc Af Amer 117 >59 mL/min/1.73   BUN/Creatinine Ratio 18 9 - 20   Sodium 142 134 - 144 mmol/L   Potassium 4.2 3.5 - 5.2 mmol/L   Chloride 104 96 - 106 mmol/L   CO2 24 20 - 29 mmol/L   Calcium 9.7 8.7 - 10.2  mg/dL   Total Protein 6.9 6.0 - 8.5 g/dL   Albumin 4.5 3.5 - 5.5 g/dL   Globulin, Total 2.4 1.5 - 4.5 g/dL   Albumin/Globulin Ratio 1.9 1.2 - 2.2   Bilirubin Total 0.5 0.0 - 1.2 mg/dL   Alkaline Phosphatase 85 39 - 117 IU/L   AST 21 0 - 40 IU/L   ALT 21 0 - 44 IU/L  Lipid Panel w/o Chol/HDL Ratio  Result Value Ref Range   Cholesterol, Total 242 (H) 100 - 199 mg/dL   Triglycerides 394 (H) 0 - 149 mg/dL   HDL 55 >39 mg/dL   VLDL Cholesterol Cal 79 (H) 5 - 40 mg/dL   LDL Calculated 108 (H) 0 - 99 mg/dL  TSH  Result Value Ref Range   TSH 3.430 0.450 - 4.500 uIU/mL  UA/M w/rflx Culture, Routine  Result Value Ref Range   Specific Gravity, UA <1.005 (L) 1.005 - 1.030   pH, UA 6.0 5.0 - 7.5   Color, UA Yellow Yellow   Appearance Ur Clear Clear   Leukocytes, UA Negative Negative   Protein, UA Negative Negative/Trace   Glucose, UA Negative Negative   Ketones, UA Negative Negative   RBC, UA Negative Negative   Bilirubin, UA Negative Negative   Urobilinogen, Ur 0.2 0.2 - 1.0 mg/dL   Nitrite, UA Negative Negative  PSA  Result Value Ref Range   Prostate Specific Ag, Serum 1.1 0.0 - 4.0 ng/mL      Assessment & Plan:   Problem List Items Addressed This Visit      Cardiovascular and Mediastinum   Hypertension - Primary    BPs stable and WNL, continue current regimen      Relevant Orders   Basic Metabolic Panel (BMET)     Other   Hyperlipidemia    Recheck lipids, continue fish oil and good lifestyle modifications. Pt declines cholesterol medicines at this time      Relevant Orders   Lipid Panel w/o Chol/HDL Ratio   Depression    Chronic, in full remission. Moods stable and under excellent control with paxil and regular exercise regimen. Continue present medications          Follow up  plan: Return in about 6 months (around 11/05/2018) for CPE.

## 2018-05-05 NOTE — Assessment & Plan Note (Signed)
Recheck lipids, continue fish oil and good lifestyle modifications. Pt declines cholesterol medicines at this time

## 2018-05-05 NOTE — Assessment & Plan Note (Signed)
Chronic, in full remission. Moods stable and under excellent control with paxil and regular exercise regimen. Continue present medications

## 2018-05-05 NOTE — Assessment & Plan Note (Signed)
BPs stable and WNL, continue current regimen 

## 2018-05-06 LAB — LIPID PANEL W/O CHOL/HDL RATIO
CHOLESTEROL TOTAL: 232 mg/dL — AB (ref 100–199)
HDL: 47 mg/dL (ref >39)
LDL CALC: 140 mg/dL — AB (ref 0–99)
TRIGLYCERIDES: 224 mg/dL — AB (ref 0–149)
VLDL Cholesterol Cal: 45 mg/dL — ABNORMAL HIGH (ref 5–40)

## 2018-05-06 LAB — BASIC METABOLIC PANEL
BUN/Creatinine Ratio: 14 (ref 9–20)
BUN: 14 mg/dL (ref 6–24)
CO2: 22 mmol/L (ref 20–29)
CREATININE: 1.02 mg/dL (ref 0.76–1.27)
Calcium: 9.1 mg/dL (ref 8.7–10.2)
Chloride: 103 mmol/L (ref 96–106)
GFR, EST AFRICAN AMERICAN: 98 mL/min/{1.73_m2} (ref >59)
GFR, EST NON AFRICAN AMERICAN: 85 mL/min/{1.73_m2} (ref >59)
Glucose: 95 mg/dL (ref 65–99)
POTASSIUM: 4.3 mmol/L (ref 3.5–5.2)
SODIUM: 140 mmol/L (ref 134–144)

## 2018-06-26 ENCOUNTER — Other Ambulatory Visit: Payer: Self-pay

## 2018-06-26 ENCOUNTER — Encounter (INDEPENDENT_AMBULATORY_CARE_PROVIDER_SITE_OTHER): Payer: Self-pay | Admitting: Vascular Surgery

## 2018-06-26 ENCOUNTER — Ambulatory Visit (INDEPENDENT_AMBULATORY_CARE_PROVIDER_SITE_OTHER): Payer: Managed Care, Other (non HMO) | Admitting: Vascular Surgery

## 2018-06-26 VITALS — BP 143/96 | HR 50 | Resp 10 | Ht 73.0 in | Wt 253.0 lb

## 2018-06-26 DIAGNOSIS — I10 Essential (primary) hypertension: Secondary | ICD-10-CM

## 2018-06-26 DIAGNOSIS — I872 Venous insufficiency (chronic) (peripheral): Secondary | ICD-10-CM

## 2018-06-26 DIAGNOSIS — E782 Mixed hyperlipidemia: Secondary | ICD-10-CM

## 2018-06-26 DIAGNOSIS — I83812 Varicose veins of left lower extremities with pain: Secondary | ICD-10-CM

## 2018-06-27 ENCOUNTER — Encounter (INDEPENDENT_AMBULATORY_CARE_PROVIDER_SITE_OTHER): Payer: Self-pay | Admitting: Vascular Surgery

## 2018-06-27 DIAGNOSIS — I83812 Varicose veins of left lower extremities with pain: Secondary | ICD-10-CM | POA: Insufficient documentation

## 2018-06-27 DIAGNOSIS — I872 Venous insufficiency (chronic) (peripheral): Secondary | ICD-10-CM | POA: Insufficient documentation
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
## 2018-08-07 ENCOUNTER — Other Ambulatory Visit: Payer: Self-pay | Admitting: Family Medicine

## 2018-09-11 ENCOUNTER — Encounter: Payer: Self-pay | Admitting: Physician Assistant

## 2018-09-11 ENCOUNTER — Emergency Department
Admission: EM | Admit: 2018-09-11 | Discharge: 2018-09-11 | Disposition: A | Discharge status: Home / Self Care | Payer: Managed Care, Other (non HMO) | Attending: Emergency Medicine | Admitting: Emergency Medicine

## 2018-09-11 ENCOUNTER — Other Ambulatory Visit: Payer: Self-pay

## 2018-09-11 DIAGNOSIS — Y999 Unspecified external cause status: Secondary | ICD-10-CM | POA: Diagnosis not present

## 2018-09-11 DIAGNOSIS — Y9389 Activity, other specified: Secondary | ICD-10-CM | POA: Insufficient documentation

## 2018-09-11 DIAGNOSIS — S61217A Laceration without foreign body of left little finger without damage to nail, initial encounter: Secondary | ICD-10-CM | POA: Insufficient documentation

## 2018-09-11 DIAGNOSIS — W298XXA Contact with other powered powered hand tools and household machinery, initial encounter: Secondary | ICD-10-CM | POA: Insufficient documentation

## 2018-09-11 DIAGNOSIS — Y929 Unspecified place or not applicable: Secondary | ICD-10-CM | POA: Insufficient documentation

## 2018-09-11 DIAGNOSIS — S6992XA Unspecified injury of left wrist, hand and finger(s), initial encounter: Secondary | ICD-10-CM | POA: Diagnosis present

## 2018-09-11 DIAGNOSIS — I10 Essential (primary) hypertension: Secondary | ICD-10-CM | POA: Insufficient documentation

## 2018-09-11 DIAGNOSIS — Z79899 Other long term (current) drug therapy: Secondary | ICD-10-CM | POA: Diagnosis not present

## 2018-09-11 MED ORDER — LIDOCAINE HCL (PF) 1 % IJ SOLN
5.0000 mL | Freq: Once | INTRAMUSCULAR | Status: DC
  Filled 2018-09-11: qty 5

## 2018-09-11 NOTE — ED Triage Notes (Signed)
Pt to ED with laceration to left pinky finger after using a drill saw. Bleeding under control at this time.

## 2018-09-11 NOTE — Discharge Instructions (Signed)
Keep the wound clean, dry, and covered. See your provider in 10 days for suture removal.
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
## 2018-09-28 ENCOUNTER — Other Ambulatory Visit: Payer: Self-pay

## 2018-09-28 ENCOUNTER — Ambulatory Visit (INDEPENDENT_AMBULATORY_CARE_PROVIDER_SITE_OTHER): Payer: Managed Care, Other (non HMO) | Admitting: Vascular Surgery

## 2018-09-28 ENCOUNTER — Ambulatory Visit (INDEPENDENT_AMBULATORY_CARE_PROVIDER_SITE_OTHER): Payer: Managed Care, Other (non HMO)

## 2018-09-28 ENCOUNTER — Encounter (INDEPENDENT_AMBULATORY_CARE_PROVIDER_SITE_OTHER): Payer: Self-pay | Admitting: Vascular Surgery

## 2018-09-28 VITALS — BP 133/82 | HR 56 | Resp 16 | Ht 73.0 in | Wt 245.8 lb

## 2018-09-28 DIAGNOSIS — I83812 Varicose veins of left lower extremities with pain: Secondary | ICD-10-CM

## 2018-09-28 DIAGNOSIS — S060XAA Concussion with loss of consciousness status unknown, initial encounter: Secondary | ICD-10-CM | POA: Insufficient documentation

## 2018-09-28 DIAGNOSIS — I10 Essential (primary) hypertension: Secondary | ICD-10-CM | POA: Diagnosis not present

## 2018-09-28 DIAGNOSIS — S060X9A Concussion with loss of consciousness of unspecified duration, initial encounter: Secondary | ICD-10-CM | POA: Insufficient documentation

## 2018-09-28 DIAGNOSIS — E782 Mixed hyperlipidemia: Secondary | ICD-10-CM

## 2018-09-28 DIAGNOSIS — I872 Venous insufficiency (chronic) (peripheral): Secondary | ICD-10-CM
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
## 2018-11-02 ENCOUNTER — Encounter (INDEPENDENT_AMBULATORY_CARE_PROVIDER_SITE_OTHER): Payer: Self-pay | Admitting: Vascular Surgery

## 2018-11-02 ENCOUNTER — Other Ambulatory Visit: Payer: Self-pay

## 2018-11-02 ENCOUNTER — Ambulatory Visit (INDEPENDENT_AMBULATORY_CARE_PROVIDER_SITE_OTHER): Payer: Managed Care, Other (non HMO) | Admitting: Vascular Surgery

## 2018-11-02 VITALS — BP 123/87 | HR 59 | Resp 16 | Wt 244.8 lb

## 2018-11-02 DIAGNOSIS — I83812 Varicose veins of left lower extremities with pain: Secondary | ICD-10-CM

## 2018-11-02 NOTE — Progress Notes (Signed)
    MRN : [id number]  Exton Suhre is a 53 y.o. (06/12/1965) male who presents with chief complaint of No chief complaint on file. .    The patient's left lower extremity was sterilely prepped and draped.  The ultrasound machine was used to visualize the left great saphenous vein throughout its course.  A segment at the knee was selected for access.  The saphenous vein was accessed without difficulty using ultrasound guidance with a micropuncture needle.   An 0.018  wire was placed beyond the saphenofemoral junction through the sheath and the microneedle was removed.  The 65 cm sheath was then placed over the wire and the wire and dilator were removed.  The laser fiber was placed through the sheath and its tip was placed approximately 2 cm below the saphenofemoral junction.  Tumescent anesthesia was then created with a dilute lidocaine solution.  Laser energy was then delivered with constant withdrawal of the sheath and laser fiber.  Approximately 1280 Joules of energy were delivered over a length of 38 cm.  Sterile dressings were placed.  The patient tolerated the procedure well without complications.

## 2018-11-06 ENCOUNTER — Ambulatory Visit (INDEPENDENT_AMBULATORY_CARE_PROVIDER_SITE_OTHER): Payer: Managed Care, Other (non HMO)

## 2018-11-06 ENCOUNTER — Other Ambulatory Visit: Payer: Self-pay

## 2018-11-06 ENCOUNTER — Other Ambulatory Visit (INDEPENDENT_AMBULATORY_CARE_PROVIDER_SITE_OTHER): Payer: Self-pay | Admitting: Vascular Surgery

## 2018-11-06 DIAGNOSIS — I83812 Varicose veins of left lower extremities with pain: Secondary | ICD-10-CM

## 2018-11-10 ENCOUNTER — Encounter: Payer: Self-pay | Admitting: Family Medicine

## 2018-11-10 ENCOUNTER — Ambulatory Visit (INDEPENDENT_AMBULATORY_CARE_PROVIDER_SITE_OTHER): Payer: Managed Care, Other (non HMO) | Admitting: Family Medicine

## 2018-11-10 ENCOUNTER — Other Ambulatory Visit: Payer: Self-pay

## 2018-11-10 VITALS — BP 119/79 | HR 66 | Temp 98.4°F | Ht 73.0 in | Wt 244.0 lb

## 2018-11-10 DIAGNOSIS — M79671 Pain in right foot: Secondary | ICD-10-CM | POA: Diagnosis not present

## 2018-11-10 DIAGNOSIS — F3342 Major depressive disorder, recurrent, in full remission: Secondary | ICD-10-CM | POA: Diagnosis not present

## 2018-11-10 DIAGNOSIS — M79672 Pain in left foot: Secondary | ICD-10-CM

## 2018-11-10 DIAGNOSIS — Z23 Encounter for immunization: Secondary | ICD-10-CM

## 2018-11-10 DIAGNOSIS — Z0000 Encounter for general adult medical examination without abnormal findings: Secondary | ICD-10-CM | POA: Diagnosis not present

## 2018-11-10 DIAGNOSIS — E782 Mixed hyperlipidemia: Secondary | ICD-10-CM | POA: Diagnosis not present

## 2018-11-10 DIAGNOSIS — I10 Essential (primary) hypertension: Secondary | ICD-10-CM

## 2018-11-10 DIAGNOSIS — J069 Acute upper respiratory infection, unspecified: Secondary | ICD-10-CM

## 2018-11-10 DIAGNOSIS — M546 Pain in thoracic spine: Secondary | ICD-10-CM

## 2018-11-10 LAB — UA/M W/RFLX CULTURE, ROUTINE
Bilirubin, UA: NEGATIVE
Glucose, UA: NEGATIVE
Ketones, UA: NEGATIVE
Leukocytes,UA: NEGATIVE
Nitrite, UA: NEGATIVE
Protein,UA: NEGATIVE
RBC, UA: NEGATIVE
Specific Gravity, UA: 1.02 (ref 1.005–1.030)
Urobilinogen, Ur: 0.2 mg/dL (ref 0.2–1.0)
pH, UA: 7 (ref 5.0–7.5)

## 2018-11-10 MED ORDER — PAROXETINE HCL 20 MG PO TABS: 20.0000 mg | ORAL_TABLET | Freq: Every day | ORAL | 3 refills | Status: DC

## 2018-11-10 MED ORDER — EPINEPHRINE 0.3 MG/0.3ML IJ SOAJ: 0.3000 mg | INTRAMUSCULAR | 1 refills | Status: DC | PRN

## 2018-11-10 MED ORDER — AMLODIPINE BESYLATE 10 MG PO TABS: ORAL_TABLET | ORAL | 1 refills | Status: DC

## 2018-11-10 MED ORDER — BENAZEPRIL HCL 40 MG PO TABS: 40.0000 mg | ORAL_TABLET | Freq: Every day | ORAL | 1 refills | Status: DC
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
## 2018-11-11 LAB — COMPREHENSIVE METABOLIC PANEL
ALT: 20 IU/L (ref 0–44)
AST: 23 IU/L (ref 0–40)
Albumin/Globulin Ratio: 1.9 (ref 1.2–2.2)
Albumin: 4.7 g/dL (ref 3.8–4.9)
Alkaline Phosphatase: 77 IU/L (ref 39–117)
BUN/Creatinine Ratio: 15 (ref 9–20)
BUN: 17 mg/dL (ref 6–24)
Bilirubin Total: 0.6 mg/dL (ref 0.0–1.2)
CO2: 18 mmol/L — ABNORMAL LOW (ref 20–29)
Calcium: 9.5 mg/dL (ref 8.7–10.2)
Chloride: 103 mmol/L (ref 96–106)
Creatinine, Ser: 1.1 mg/dL (ref 0.76–1.27)
GFR calc Af Amer: 89 mL/min/{1.73_m2} (ref >59)
GFR calc non Af Amer: 77 mL/min/{1.73_m2} (ref >59)
Globulin, Total: 2.5 g/dL (ref 1.5–4.5)
Glucose: 97 mg/dL (ref 65–99)
Potassium: 4.7 mmol/L (ref 3.5–5.2)
Sodium: 142 mmol/L (ref 134–144)
Total Protein: 7.2 g/dL (ref 6.0–8.5)

## 2018-11-11 LAB — LIPID PANEL W/O CHOL/HDL RATIO
Cholesterol, Total: 271 mg/dL — ABNORMAL HIGH (ref 100–199)
HDL: 56 mg/dL (ref >39)
LDL Chol Calc (NIH): 175 mg/dL — ABNORMAL HIGH (ref 0–99)
Triglycerides: 212 mg/dL — ABNORMAL HIGH (ref 0–149)
VLDL Cholesterol Cal: 40 mg/dL (ref 5–40)

## 2018-11-11 LAB — CBC WITH DIFFERENTIAL/PLATELET
Basophils Absolute: 0.1 10*3/uL (ref 0.0–0.2)
Basos: 1 %
EOS (ABSOLUTE): 0.2 10*3/uL (ref 0.0–0.4)
Eos: 3 %
Hematocrit: 43.7 % (ref 37.5–51.0)
Hemoglobin: 15 g/dL (ref 13.0–17.7)
Immature Grans (Abs): 0 10*3/uL (ref 0.0–0.1)
Immature Granulocytes: 0 %
Lymphocytes Absolute: 1.9 10*3/uL (ref 0.7–3.1)
Lymphs: 33 %
MCH: 31.8 pg (ref 26.6–33.0)
MCHC: 34.3 g/dL (ref 31.5–35.7)
MCV: 93 fL (ref 79–97)
Monocytes Absolute: 0.4 10*3/uL (ref 0.1–0.9)
Monocytes: 7 %
Neutrophils Absolute: 3.1 10*3/uL (ref 1.4–7.0)
Neutrophils: 56 %
Platelets: 208 10*3/uL (ref 150–450)
RBC: 4.72 x10E6/uL (ref 4.14–5.80)
RDW: 11.9 % (ref 11.6–15.4)
WBC: 5.6 10*3/uL (ref 3.4–10.8)

## 2018-11-11 LAB — EUROIMMUN SARS-COV-2 AB, IGG: Euroimmun SARS-CoV-2 Ab, IgG: NEGATIVE

## 2018-11-11 LAB — SAR COV2 SEROLOGY (COVID19)AB(IGG),IA

## 2018-11-11 LAB — TSH: TSH: 5.79 u[IU]/mL — ABNORMAL HIGH (ref 0.450–4.500)

## 2018-11-13 ENCOUNTER — Telehealth: Payer: Self-pay | Admitting: Family Medicine

## 2018-11-13 NOTE — Telephone Encounter (Signed)
Called to review elevated cholesterol and thyroid labs - patient states diet got off track during pandemic but he is back committed to a strict keto diet again and would like to hold off on medications at this point and recheck things in 6 months

## 2018-11-14 ENCOUNTER — Telehealth (INDEPENDENT_AMBULATORY_CARE_PROVIDER_SITE_OTHER): Payer: Self-pay

## 2018-11-15 NOTE — Telephone Encounter (Signed)
I left a message on patient voicemail to return call back to the office

## 2018-11-15 NOTE — Assessment & Plan Note (Signed)
Moods continue to be stale on paxil, continue current regimen

## 2018-11-15 NOTE — Assessment & Plan Note (Signed)
Patient continues to decline medications for this, wanting to keep working on diet and exercise for control. Risks reviewed. Recheck lipids

## 2018-11-15 NOTE — Telephone Encounter (Signed)
Some patients do continue to have pain and discomfort as well as the pulling feeling that you are describing.  Generally as the area heals this will subside.  Most people tend to have relief within 2 weeks however it can take up to 4 weeks.  The patient should utilize NSAIDs such as ibuprofen daily.  He can take 800 mg every 6-8 hours.  He can also alternate this with Tylenol.  Alternating warm and cool compresses can also help with the pain in this area.

## 2018-11-15 NOTE — Telephone Encounter (Signed)
Patient has been made aware with medical advice from Eulogio Ditch NP and verbalize understanding

## 2018-11-15 NOTE — Assessment & Plan Note (Signed)
BPs stable and WNL, continue current regimen 

## 2018-11-22 ENCOUNTER — Encounter: Payer: Self-pay | Admitting: Podiatry

## 2018-11-22 ENCOUNTER — Other Ambulatory Visit: Payer: Self-pay | Admitting: Podiatry

## 2018-11-22 ENCOUNTER — Ambulatory Visit: Payer: Managed Care, Other (non HMO) | Admitting: Podiatry

## 2018-11-22 ENCOUNTER — Ambulatory Visit (INDEPENDENT_AMBULATORY_CARE_PROVIDER_SITE_OTHER): Payer: Managed Care, Other (non HMO)

## 2018-11-22 ENCOUNTER — Other Ambulatory Visit: Payer: Self-pay

## 2018-11-22 VITALS — BP 129/90 | HR 64 | Resp 16

## 2018-11-22 DIAGNOSIS — M722 Plantar fascial fibromatosis: Secondary | ICD-10-CM

## 2018-11-22 DIAGNOSIS — M779 Enthesopathy, unspecified: Secondary | ICD-10-CM
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
## 2018-11-22 NOTE — Patient Instructions (Signed)

## 2018-11-22 NOTE — Progress Notes (Signed)
    MRN : [id number]  Anthony Hunter is a 53 y.o. (November 03, 1965) male who presents with chief complaint of painful varicose veins.    The patient's right  lower extremity was sterilely prepped and draped.  The ultrasound machine was used to visualize the right great saphenous vein throughout its course.  A segment at the knee was selected for access.  The saphenous vein was accessed without difficulty using ultrasound guidance with a micropuncture needle.   An 0.018  wire was placed beyond the saphenofemoral junction through the sheath and the microneedle was removed.  The 65 cm sheath was then placed over the wire and the wire and dilator were removed.  The laser fiber was placed through the sheath and its tip was placed approximately 2 cm below the saphenofemoral junction.  Tumescent anesthesia was then created with a dilute lidocaine solution.  Laser energy was then delivered with constant withdrawal of the sheath and laser fiber.  Approximately 1618 Joules of energy were delivered over a length of 37 cm.  Sterile dressings were placed.  The patient tolerated the procedure well without complications.

## 2018-11-23 ENCOUNTER — Ambulatory Visit (INDEPENDENT_AMBULATORY_CARE_PROVIDER_SITE_OTHER): Payer: Managed Care, Other (non HMO) | Admitting: Vascular Surgery

## 2018-11-23 ENCOUNTER — Other Ambulatory Visit: Payer: Self-pay

## 2018-11-23 ENCOUNTER — Encounter (INDEPENDENT_AMBULATORY_CARE_PROVIDER_SITE_OTHER): Payer: Self-pay | Admitting: Vascular Surgery

## 2018-11-23 VITALS — BP 134/92 | HR 60 | Resp 10 | Ht 73.0 in | Wt 247.0 lb

## 2018-11-23 DIAGNOSIS — I83811 Varicose veins of right lower extremities with pain: Secondary | ICD-10-CM | POA: Diagnosis not present

## 2018-11-23 DIAGNOSIS — I83812 Varicose veins of left lower extremities with pain: Secondary | ICD-10-CM

## 2018-11-27 ENCOUNTER — Other Ambulatory Visit (INDEPENDENT_AMBULATORY_CARE_PROVIDER_SITE_OTHER): Payer: Self-pay | Admitting: Vascular Surgery

## 2018-11-27 ENCOUNTER — Encounter (INDEPENDENT_AMBULATORY_CARE_PROVIDER_SITE_OTHER): Payer: Managed Care, Other (non HMO)

## 2018-11-27 DIAGNOSIS — I83819 Varicose veins of unspecified lower extremities with pain: Secondary | ICD-10-CM

## 2018-11-28 ENCOUNTER — Ambulatory Visit (INDEPENDENT_AMBULATORY_CARE_PROVIDER_SITE_OTHER): Payer: Managed Care, Other (non HMO)

## 2018-11-28 ENCOUNTER — Other Ambulatory Visit: Payer: Self-pay

## 2018-11-28 DIAGNOSIS — I83819 Varicose veins of unspecified lower extremities with pain: Secondary | ICD-10-CM | POA: Diagnosis not present

## 2018-11-30 ENCOUNTER — Ambulatory Visit (INDEPENDENT_AMBULATORY_CARE_PROVIDER_SITE_OTHER): Payer: Managed Care, Other (non HMO) | Admitting: Nurse Practitioner

## 2018-12-20 ENCOUNTER — Encounter: Payer: Self-pay | Admitting: Podiatry

## 2018-12-20 ENCOUNTER — Ambulatory Visit: Payer: Managed Care, Other (non HMO) | Admitting: Podiatry

## 2018-12-20 ENCOUNTER — Other Ambulatory Visit: Payer: Self-pay

## 2018-12-20 DIAGNOSIS — M722 Plantar fascial fibromatosis: Secondary | ICD-10-CM

## 2018-12-20 NOTE — Patient Instructions (Signed)

## 2018-12-20 NOTE — Progress Notes (Signed)
Patient presents for orthotic pick up. He states "my feet still hurt, but I don't think I need to see the doctor today.  I want to try my orthotics first.   Verbal and written break in and wear instructions given.  Patient will follow up in 4 weeks with Dr if symptoms worsen or fail to improve.

## 2018-12-21 ENCOUNTER — Ambulatory Visit (INDEPENDENT_AMBULATORY_CARE_PROVIDER_SITE_OTHER): Payer: Managed Care, Other (non HMO) | Admitting: Nurse Practitioner

## 2018-12-28 ENCOUNTER — Ambulatory Visit (INDEPENDENT_AMBULATORY_CARE_PROVIDER_SITE_OTHER): Payer: Managed Care, Other (non HMO) | Admitting: Nurse Practitioner

## 2019-01-22 ENCOUNTER — Ambulatory Visit: Payer: Managed Care, Other (non HMO) | Admitting: Podiatry

## 2019-02-01 ENCOUNTER — Encounter (INDEPENDENT_AMBULATORY_CARE_PROVIDER_SITE_OTHER): Payer: Self-pay | Admitting: Nurse Practitioner

## 2019-05-02 ENCOUNTER — Encounter: Payer: Self-pay | Admitting: Family Medicine

## 2019-05-11 ENCOUNTER — Encounter: Payer: Managed Care, Other (non HMO) | Admitting: Family Medicine

## 2019-05-12 ENCOUNTER — Ambulatory Visit: Payer: Managed Care, Other (non HMO) | Attending: Internal Medicine

## 2019-05-12 DIAGNOSIS — Z23 Encounter for immunization: Secondary | ICD-10-CM

## 2019-05-12 NOTE — Progress Notes (Signed)
   Covid-19 Vaccination Clinic  Name:  Anthony Hunter    MRN: [id number] DOB: 08-06-1965  05/12/2019  [name] was observed post Covid-19 immunization for 15 minutes without incident. He was provided with Vaccine Information Sheet and instruction to access the V-Safe system.   [name] was instructed to call 911 with any severe reactions post vaccine: Marland Kitchen Difficulty breathing  . Swelling of face and throat  . A fast heartbeat  . A bad rash all over body  . Dizziness and weakness   Immunizations Administered    Name Date Dose VIS Date Route   Pfizer COVID-19 Vaccine 05/12/2019  5:14 PM 0.3 mL 01/26/2019 Intramuscular   Manufacturer: [employer], [employer]   Lot: [id number]   [place]: [id number]

## 2019-06-01 ENCOUNTER — Other Ambulatory Visit: Payer: Self-pay

## 2019-06-01 ENCOUNTER — Encounter: Payer: Self-pay | Admitting: Family Medicine

## 2019-06-01 ENCOUNTER — Ambulatory Visit (INDEPENDENT_AMBULATORY_CARE_PROVIDER_SITE_OTHER): Payer: Managed Care, Other (non HMO) | Admitting: Family Medicine

## 2019-06-01 VITALS — BP 117/80 | HR 64 | Temp 97.6°F | Ht 73.23 in | Wt 261.2 lb

## 2019-06-01 DIAGNOSIS — F3342 Major depressive disorder, recurrent, in full remission: Secondary | ICD-10-CM

## 2019-06-01 DIAGNOSIS — E782 Mixed hyperlipidemia: Secondary | ICD-10-CM | POA: Diagnosis not present

## 2019-06-01 DIAGNOSIS — Z8639 Personal history of other endocrine, nutritional and metabolic disease: Secondary | ICD-10-CM | POA: Diagnosis not present

## 2019-06-01 DIAGNOSIS — Z0000 Encounter for general adult medical examination without abnormal findings: Secondary | ICD-10-CM

## 2019-06-01 DIAGNOSIS — I10 Essential (primary) hypertension: Secondary | ICD-10-CM

## 2019-06-01 DIAGNOSIS — M722 Plantar fascial fibromatosis: Secondary | ICD-10-CM

## 2019-06-01 DIAGNOSIS — R5383 Other fatigue: Secondary | ICD-10-CM

## 2019-06-01 LAB — UA/M W/RFLX CULTURE, ROUTINE
Bilirubin, UA: NEGATIVE
Glucose, UA: NEGATIVE
Ketones, UA: NEGATIVE
Leukocytes,UA: NEGATIVE
Nitrite, UA: NEGATIVE
Protein,UA: NEGATIVE
RBC, UA: NEGATIVE
Specific Gravity, UA: 1.01 (ref 1.005–1.030)
Urobilinogen, Ur: 0.2 mg/dL (ref 0.2–1.0)
pH, UA: 6.5 (ref 5.0–7.5)

## 2019-06-01 LAB — MICROALBUMIN, URINE WAIVED
Creatinine, Urine Waived: 10 mg/dL (ref 10–300)
Microalb, Ur Waived: 10 mg/L (ref 0–19)
Microalb/Creat Ratio: <30 mg/g (ref <30)

## 2019-06-01 MED ORDER — DICLOFENAC SODIUM 1 % EX GEL: 4.0000 g | Freq: Four times a day (QID) | CUTANEOUS | 3 refills | Status: DC

## 2019-06-01 MED ORDER — PAROXETINE HCL 20 MG PO TABS: 20.0000 mg | ORAL_TABLET | Freq: Every day | ORAL | 1 refills | Status: DC

## 2019-06-01 MED ORDER — BENAZEPRIL HCL 40 MG PO TABS: 40.0000 mg | ORAL_TABLET | Freq: Every day | ORAL | 1 refills | Status: DC

## 2019-06-01 MED ORDER — AMLODIPINE BESYLATE 10 MG PO TABS: ORAL_TABLET | ORAL | 1 refills | Status: DC

## 2019-06-01 MED ORDER — EPINEPHRINE 0.3 MG/0.3ML IJ SOAJ: 0.3000 mg | INTRAMUSCULAR | 1 refills | Status: DC | PRN

## 2019-06-01 NOTE — Assessment & Plan Note (Signed)
Under good control on current regimen. Continue current regimen. Continue to monitor. Call with any concerns. Refills given. Labs drawn today.   

## 2019-06-01 NOTE — Patient Instructions (Signed)
Health Maintenance, Male Adopting a healthy lifestyle and getting preventive care are important in promoting health and wellness. Ask your health care provider about:  The right schedule for you to have regular tests and exams.  Things you can do on your own to prevent diseases and keep yourself healthy. What should I know about diet, weight, and exercise? Eat a healthy diet   Eat a diet that includes plenty of vegetables, fruits, low-fat dairy products, and lean protein.  Do not eat a lot of foods that are high in solid fats, added sugars, or sodium. Maintain a healthy weight Body mass index (BMI) is a measurement that can be used to identify possible weight problems. It estimates body fat based on height and weight. Your health care provider can help determine your BMI and help you achieve or maintain a healthy weight. Get regular exercise Get regular exercise. This is one of the most important things you can do for your health. Most adults should:  Exercise for at least 150 minutes each week. The exercise should increase your heart rate and make you sweat (moderate-intensity exercise).  Do strengthening exercises at least twice a week. This is in addition to the moderate-intensity exercise.  Spend less time sitting. Even light physical activity can be beneficial. Watch cholesterol and blood lipids Have your blood tested for lipids and cholesterol at 53 years of age, then have this test every 5 years. You may need to have your cholesterol levels checked more often if:  Your lipid or cholesterol levels are high.  You are older than 53 years of age.  You are at high risk for heart disease. What should I know about cancer screening? Many types of cancers can be detected early and may often be prevented. Depending on your health history and family history, you may need to have cancer screening at various ages. This may include screening for:  Colorectal cancer.  Prostate  cancer.  Skin cancer.  Lung cancer. What should I know about heart disease, diabetes, and high blood pressure? Blood pressure and heart disease  High blood pressure causes heart disease and increases the risk of stroke. This is more likely to develop in people who have high blood pressure readings, are of African descent, or are overweight.  Talk with your health care provider about your target blood pressure readings.  Have your blood pressure checked: ? Every 3-5 years if you are 18-39 years of age. ? Every year if you are 40 years old or older.  If you are between the ages of 65 and 75 and are a current or former smoker, ask your health care provider if you should have a one-time screening for abdominal aortic aneurysm (AAA). Diabetes Have regular diabetes screenings. This checks your fasting blood sugar level. Have the screening done:  Once every three years after age 45 if you are at a normal weight and have a low risk for diabetes.  More often and at a younger age if you are overweight or have a high risk for diabetes. What should I know about preventing infection? Hepatitis B If you have a higher risk for hepatitis B, you should be screened for this virus. Talk with your health care provider to find out if you are at risk for hepatitis B infection. Hepatitis C Blood testing is recommended for:  Everyone born from 1945 through 1965.  Anyone with known risk factors for hepatitis C. Sexually transmitted infections (STIs)  You should be screened each year   for STIs, including gonorrhea and chlamydia, if: ? You are sexually active and are younger than 53 years of age. ? You are older than 53 years of age and your health care provider tells you that you are at risk for this type of infection. ? Your sexual activity has changed since you were last screened, and you are at increased risk for chlamydia or gonorrhea. Ask your health care provider if you are at risk.  Ask your  health care provider about whether you are at high risk for HIV. Your health care provider may recommend a prescription medicine to help prevent HIV infection. If you choose to take medicine to prevent HIV, you should first get tested for HIV. You should then be tested every 3 months for as long as you are taking the medicine. Follow these instructions at home: Lifestyle  Do not use any products that contain nicotine or tobacco, such as cigarettes, e-cigarettes, and chewing tobacco. If you need help quitting, ask your health care provider.  Do not use street drugs.  Do not share needles.  Ask your health care provider for help if you need support or information about quitting drugs. Alcohol use  Do not drink alcohol if your health care provider tells you not to drink.  If you drink alcohol: ? Limit how much you have to 0-2 drinks a day. ? Be aware of how much alcohol is in your drink. In the [place] one drink equals one 12 oz bottle of beer (355 mL), one 5 oz glass of wine (148 mL), or one 1 oz glass of hard liquor (44 mL). General instructions  Schedule regular health, dental, and eye exams.  Stay current with your vaccines.  Tell your health care provider if: ? You often feel depressed. ? You have ever been abused or do not feel safe at home. Summary  Adopting a healthy lifestyle and getting preventive care are important in promoting health and wellness.  Follow your health care provider's instructions about healthy diet, exercising, and getting tested or screened for diseases.  Follow your health care provider's instructions on monitoring your cholesterol and blood pressure. This information is not intended to replace advice given to you by your health care provider. Make sure you discuss any questions you have with your health care provider. Document Revised: 01/25/2018 Document Reviewed: 01/25/2018 Elsevier Patient Education  [place].  Plantar Fasciitis Rehab Ask  your health care provider which exercises are safe for you. Do exercises exactly as told by your health care provider and adjust them as directed. It is normal to feel mild stretching, pulling, tightness, or discomfort as you do these exercises. Stop right away if you feel sudden pain or your pain gets worse. Do not begin these exercises until told by your health care provider. Stretching and range-of-motion exercises These exercises warm up your muscles and joints and improve the movement and flexibility of your foot. These exercises also help to relieve pain. Plantar fascia stretch  1. Sit with your left / right leg crossed over your opposite knee. 2. Hold your heel with one hand with that thumb near your arch. With your other hand, hold your toes and gently pull them back toward the top of your foot. You should feel a stretch on the bottom of your toes or your foot (plantar fascia) or both. 3. Hold this stretch for__________ seconds. 4. Slowly release your toes and return to the starting position. Repeat __________ times. Complete this exercise __________  times a day. Gastrocnemius stretch, standing This exercise is also called a calf (gastroc) stretch. It stretches the muscles in the back of the upper calf. 1. Stand with your hands against a wall. 2. Extend your left / right leg behind you, and bend your front knee slightly. 3. Keeping your heels on the floor and your back knee straight, shift your weight toward the wall. Do not arch your back. You should feel a gentle stretch in your upper left / right calf. 4. Hold this position for __________ seconds. Repeat __________ times. Complete this exercise __________ times a day. Soleus stretch, standing This exercise is also called a calf (soleus) stretch. It stretches the muscles in the back of the lower calf. 1. Stand with your hands against a wall. 2. Extend your left / right leg behind you, and bend your front knee slightly. 3. Keeping your  heels on the floor, bend your back knee and shift your weight slightly over your back leg. You should feel a gentle stretch deep in your lower calf. 4. Hold this position for __________ seconds. Repeat __________ times. Complete this exercise __________ times a day. Gastroc and soleus stretch, standing step This exercise stretches the muscles in the back of the lower leg. These muscles are in the upper calf (gastrocnemius) and the lower calf (soleus). 1. Stand with the ball of your left / right foot on a step. The ball of your foot is on the walking surface, right under your toes. 2. Keep your other foot firmly on the same step. 3. Hold on to the wall or a railing for balance. 4. Slowly lift your other foot, allowing your body weight to press your left / right heel down over the edge of the step. You should feel a stretch in your left / right calf. 5. Hold this position for __________ seconds. 6. Return both feet to the step. 7. Repeat this exercise with a slight bend in your left / right knee. Repeat __________ times with your left / right knee straight and __________ times with your left / right knee bent. Complete this exercise __________ times a day. Balance exercise This exercise builds your balance and strength control of your arch to help take pressure off your plantar fascia. Single leg stand If this exercise is too easy, you can try it with your eyes closed or while standing on a pillow. 1. Without shoes, stand near a railing or in a doorway. You may hold on to the railing or door frame as needed. 2. Stand on your left / right foot. Keep your big toe down on the floor and try to keep your arch lifted. Do not let your foot roll inward. 3. Hold this position for __________ seconds. Repeat __________ times. Complete this exercise __________ times a day. This information is not intended to replace advice given to you by your health care provider. Make sure you discuss any questions you have  with your health care provider. Document Revised: 05/25/2018 Document Reviewed: 11/30/2017 Elsevier Patient Education  [place].

## 2019-06-01 NOTE — Assessment & Plan Note (Signed)
Rechecking levels today. Await results. Treat as needed.  
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
## 2019-06-02 ENCOUNTER — Ambulatory Visit: Payer: Managed Care, Other (non HMO) | Attending: Internal Medicine

## 2019-06-02 DIAGNOSIS — Z23 Encounter for immunization: Secondary | ICD-10-CM

## 2019-06-02 LAB — CBC WITH DIFFERENTIAL/PLATELET
Basophils Absolute: 0 10*3/uL (ref 0.0–0.2)
Basos: 1 %
EOS (ABSOLUTE): 0.1 10*3/uL (ref 0.0–0.4)
Eos: 2 %
Hematocrit: 44.6 % (ref 37.5–51.0)
Hemoglobin: 15.8 g/dL (ref 13.0–17.7)
Immature Grans (Abs): 0 10*3/uL (ref 0.0–0.1)
Immature Granulocytes: 0 %
Lymphocytes Absolute: 2 10*3/uL (ref 0.7–3.1)
Lymphs: 33 %
MCH: 32.6 pg (ref 26.6–33.0)
MCHC: 35.4 g/dL (ref 31.5–35.7)
MCV: 92 fL (ref 79–97)
Monocytes Absolute: 0.4 10*3/uL (ref 0.1–0.9)
Monocytes: 7 %
Neutrophils Absolute: 3.4 10*3/uL (ref 1.4–7.0)
Neutrophils: 57 %
Platelets: 230 10*3/uL (ref 150–450)
RBC: 4.85 x10E6/uL (ref 4.14–5.80)
RDW: 12.3 % (ref 11.6–15.4)
WBC: 6 10*3/uL (ref 3.4–10.8)

## 2019-06-02 LAB — LIPID PANEL W/O CHOL/HDL RATIO
Cholesterol, Total: 291 mg/dL — ABNORMAL HIGH (ref 100–199)
HDL: 52 mg/dL (ref >39)
LDL Chol Calc (NIH): 185 mg/dL — ABNORMAL HIGH (ref 0–99)
Triglycerides: 279 mg/dL — ABNORMAL HIGH (ref 0–149)
VLDL Cholesterol Cal: 54 mg/dL — ABNORMAL HIGH (ref 5–40)

## 2019-06-02 LAB — COMPREHENSIVE METABOLIC PANEL
ALT: 24 IU/L (ref 0–44)
AST: 20 IU/L (ref 0–40)
Albumin/Globulin Ratio: 2 (ref 1.2–2.2)
Albumin: 4.9 g/dL (ref 3.8–4.9)
Alkaline Phosphatase: 79 IU/L (ref 39–117)
BUN/Creatinine Ratio: 13 (ref 9–20)
BUN: 13 mg/dL (ref 6–24)
Bilirubin Total: 0.6 mg/dL (ref 0.0–1.2)
CO2: 25 mmol/L (ref 20–29)
Calcium: 10.2 mg/dL (ref 8.7–10.2)
Chloride: 97 mmol/L (ref 96–106)
Creatinine, Ser: 1.01 mg/dL (ref 0.76–1.27)
GFR calc Af Amer: 98 mL/min/{1.73_m2} (ref >59)
GFR calc non Af Amer: 85 mL/min/{1.73_m2} (ref >59)
Globulin, Total: 2.4 g/dL (ref 1.5–4.5)
Glucose: 70 mg/dL (ref 65–99)
Potassium: 4 mmol/L (ref 3.5–5.2)
Sodium: 136 mmol/L (ref 134–144)
Total Protein: 7.3 g/dL (ref 6.0–8.5)

## 2019-06-02 LAB — PSA: Prostate Specific Ag, Serum: 1.3 ng/mL (ref 0.0–4.0)

## 2019-06-02 LAB — TSH: TSH: 4.69 u[IU]/mL — ABNORMAL HIGH (ref 0.450–4.500)

## 2019-06-02 NOTE — Progress Notes (Signed)
   Covid-19 Vaccination Clinic  Name:  Anthony Hunter    MRN: [id number] DOB: Sep 20, 1965  06/02/2019  Anthony Hunter was observed post Covid-19 immunization for 30 minutes without incident. He was provided with Vaccine Information Sheet and instruction to access the V-Safe system.   Anthony Hunter was instructed to call 911 with any severe reactions post vaccine: Marland Kitchen Difficulty breathing  . Swelling of face and throat  . A fast heartbeat  . A bad rash all over body  . Dizziness and weakness   Immunizations Administered    Name Date Dose VIS Date Route   Pfizer COVID-19 Vaccine 06/02/2019  5:10 PM 0.3 mL 01/26/2019 Intramuscular   Manufacturer: [place]   Lot: [id number]   [place]: [id number]

## 2019-06-13 ENCOUNTER — Other Ambulatory Visit: Payer: Self-pay | Admitting: Family Medicine

## 2019-06-15 LAB — TESTOSTERONE, FREE, TOTAL, SHBG
Sex Hormone Binding: 44.2 nmol/L (ref 19.3–76.4)
Testosterone, Free: 11.8 pg/mL (ref 7.2–24.0)
Testosterone: 593 ng/dL (ref 264–916)

## 2019-11-05 ENCOUNTER — Other Ambulatory Visit: Payer: Self-pay | Admitting: Family Medicine

## 2019-11-05 DIAGNOSIS — F3342 Major depressive disorder, recurrent, in full remission: Secondary | ICD-10-CM

## 2019-12-07 ENCOUNTER — Ambulatory Visit: Payer: Managed Care, Other (non HMO) | Admitting: Family Medicine

## 2019-12-16 ENCOUNTER — Other Ambulatory Visit: Payer: Self-pay | Admitting: Family Medicine

## 2019-12-16 NOTE — Telephone Encounter (Signed)
Requested Prescriptions  Pending Prescriptions Disp Refills   amLODipine (NORVASC) 10 MG tablet [Pharmacy Med Name: AMLODIPINE BESYLATE 10MG  TABLETS] 30 tablet 0    Sig: TAKE 1 TABLET(10 MG) BY MOUTH DAILY     Cardiovascular:  Calcium Channel Blockers Failed - 12/16/2019 11:39 AM      Failed - Valid encounter within last 6 months    Recent Outpatient Visits          6 months ago Routine general medical examination at a health care facility   [facility], [place], [place]   1 year ago Essential hypertension   [place], [place], [place]   1 year ago Hypertension, unspecified type   [place], [place], [place]   2 years ago Hypertension, unspecified type   [place], [place], [place]   2 years ago Colon cancer screening   [facility] Terrilee Croak, [place], [place]             Passed - Last BP in normal range    BP Readings from Last 1 Encounters:  06/01/19 117/80

## 2019-12-16 NOTE — Telephone Encounter (Signed)
Pt has only seen Merrie Roof - refill needs cosigner.

## 2019-12-17 NOTE — Telephone Encounter (Signed)
Patient last seen by Dr. Lenna Sciara on 06/01/19.

## 2019-12-21 ENCOUNTER — Telehealth: Payer: Self-pay | Admitting: Family Medicine

## 2019-12-21 MED ORDER — AMLODIPINE BESYLATE 10 MG PO TABS: 10.0000 mg | ORAL_TABLET | Freq: Every day | ORAL | 0 refills | Status: DC

## 2019-12-21 NOTE — Telephone Encounter (Signed)
Medication Refill - Medication: amlodipine 90 day supply needed for insurance  Has the patient contacted their pharmacy? Yes.   (Agent: If no, request that the patient contact the pharmacy for the refill.) (Agent: If yes, when and what did the pharmacy advise?)  Preferred Pharmacy (with phone number or street name): [facility] [place], [place]: Please be advised that RX refills may take up to 3 business days. We ask that you follow-up with your pharmacy.

## 2019-12-21 NOTE — Telephone Encounter (Signed)
Overdue for appointment and no follow up scheduled.

## 2019-12-21 NOTE — Addendum Note (Signed)
Addended by: Valerie Roys on: 12/21/2019 04:56 PM   Modules accepted: Orders

## 2019-12-21 NOTE — Telephone Encounter (Signed)
Can this be changed to a 90 day supply per insurance?

## 2019-12-21 NOTE — Telephone Encounter (Signed)
Patient called and stated he was unaware that he needed an appointment. I scheduled him an appointment 12/31/2019 at 9:20am. He is out of medication and would like for the correct quantity to be called in so he can get his medication and he will be at the appointment as scheduled. Please advice

## 2019-12-31 ENCOUNTER — Other Ambulatory Visit: Payer: Self-pay

## 2019-12-31 ENCOUNTER — Ambulatory Visit (INDEPENDENT_AMBULATORY_CARE_PROVIDER_SITE_OTHER): Payer: Managed Care, Other (non HMO) | Admitting: Family Medicine

## 2019-12-31 ENCOUNTER — Encounter: Payer: Self-pay | Admitting: Family Medicine

## 2019-12-31 VITALS — BP 123/76 | HR 57 | Temp 98.2°F | Ht 73.0 in | Wt 258.4 lb

## 2019-12-31 DIAGNOSIS — M545 Low back pain, unspecified: Secondary | ICD-10-CM

## 2019-12-31 DIAGNOSIS — I10 Essential (primary) hypertension: Secondary | ICD-10-CM | POA: Diagnosis not present

## 2019-12-31 DIAGNOSIS — Z1159 Encounter for screening for other viral diseases: Secondary | ICD-10-CM

## 2019-12-31 DIAGNOSIS — E782 Mixed hyperlipidemia: Secondary | ICD-10-CM

## 2019-12-31 DIAGNOSIS — F3342 Major depressive disorder, recurrent, in full remission: Secondary | ICD-10-CM

## 2019-12-31 DIAGNOSIS — G8929 Other chronic pain: Secondary | ICD-10-CM

## 2019-12-31 MED ORDER — BENAZEPRIL HCL 40 MG PO TABS
40.0000 mg | ORAL_TABLET | Freq: Every day | ORAL | 1 refills | Status: DC
Start: 2019-12-31 — End: 2020-08-24

## 2019-12-31 MED ORDER — AMLODIPINE BESYLATE 10 MG PO TABS: 10.0000 mg | ORAL_TABLET | Freq: Every day | ORAL | 1 refills | Status: DC

## 2019-12-31 MED ORDER — CYCLOBENZAPRINE HCL 10 MG PO TABS: 10.0000 mg | ORAL_TABLET | Freq: Every day | ORAL | 0 refills | Status: DC

## 2019-12-31 MED ORDER — PAROXETINE HCL 20 MG PO TABS: ORAL_TABLET | ORAL | 1 refills | Status: DC

## 2019-12-31 MED ORDER — EPINEPHRINE 0.3 MG/0.3ML IJ SOAJ: 0.3000 mg | INTRAMUSCULAR | 1 refills | Status: DC | PRN

## 2019-12-31 NOTE — Progress Notes (Signed)
BP 123/76   Pulse (!) 57   Temp 98.2 F (36.8 C) (Oral)   Ht 6\' 1"  (1.854 m)   Wt 258 lb 6.4 oz (117.2 kg)   SpO2 98%   BMI 34.09 kg/m    Subjective:    Patient ID: Anthony Hunter, male    DOB: 12-31-1965, 54 y.o.   MRN: [id number]  HPI: Anthony Hunter is a 54 y.o. male  Chief Complaint  Patient presents with  . Hypertension    Patient is experiencing lower back pain, has been getting worse for the last 6 months, 54 yr old injury  . Hyperlipidemia   BACK PAIN Duration: chronic- worse over the last 6 months.  Mechanism of injury: playing rugby Location: Left and low back Onset: sudden Severity: mild to severe Quality: dull Frequency: intermittent, waxing and waning Radiation: buttocks Aggravating factors: overhead work Alleviating factors: chiropractor, heat and cold Status: worse Treatments attempted: rest, ice, heat, APAP, ibuprofen and aleve, chiropractor  Relief with NSAIDs?: no Nighttime pain:  no Paresthesias / decreased sensation:  no Bowel / bladder incontinence:  no Fevers:  no Dysuria / urinary frequency:  no  HYPERTENSION / HYPERLIPIDEMIA Satisfied with current treatment? yes Duration of hypertension: chronic BP monitoring frequency: not checking BP medication side effects: no Past BP meds: amlodipine, benazepril Duration of hyperlipidemia: chronic Cholesterol medication side effects: not on anything Cholesterol supplements: fish oil Past cholesterol medications: none Medication compliance: excellent compliance Aspirin: no Recent stressors: no Recurrent headaches: no Visual changes: no Palpitations: no Dyspnea: no Chest pain: no Lower extremity edema: no Dizzy/lightheaded: no  DEPRESSION Mood status: controlled Satisfied with current treatment?: yes Symptom severity: mild  Duration of current treatment : chronic Side effects: no Medication compliance: excellent compliance Psychotherapy/counseling: no  Previous psychiatric medications:  paxil Depressed mood: no Anxious mood: no Anhedonia: no Significant weight loss or gain: no Insomnia: no  Fatigue: no Feelings of worthlessness or guilt: no Impaired concentration/indecisiveness: no Suicidal ideations: no Hopelessness: no Crying spells: no Depression screen [facility] 2/9 12/31/2019 06/01/2019 11/10/2018 05/05/2018 11/04/2017  Decreased Interest 0 0 0 0 0  Down, Depressed, Hopeless 0 0 0 0 0  PHQ - 2 Score 0 0 0 0 0  Altered sleeping 0 1 1 0 1  Tired, decreased energy 0 1 0 0 1  Change in appetite 0 0 0 0 0  Feeling bad or failure about yourself  0 0 0 0 0  Trouble concentrating 0 0 0 0 0  Moving slowly or fidgety/restless 0 0 0 0 0  Suicidal thoughts 0 0 0 0 0  PHQ-9 Score 0 2 1 0 2  Difficult doing work/chores - - - Not difficult at all -     Relevant past medical, surgical, family and social history reviewed and updated as indicated. Interim medical history since our last visit reviewed. Allergies and medications reviewed and updated.  Review of Systems  Constitutional: Negative.   HENT: Negative.   Respiratory: Negative.   Cardiovascular: Negative.   Gastrointestinal: Negative.   Musculoskeletal: Negative.   Neurological: Negative.   Psychiatric/Behavioral: Negative.     Per HPI unless specifically indicated above     Objective:    BP 123/76   Pulse (!) 57   Temp 98.2 F (36.8 C) (Oral)   Ht 6\' 1"  (1.854 m)   Wt 258 lb 6.4 oz (117.2 kg)   SpO2 98%   BMI 34.09 kg/m   Wt Readings from Last 3 Encounters:  12/31/19 258  lb 6.4 oz (117.2 kg)  06/01/19 261 lb 3.2 oz (118.5 kg)  11/23/18 247 lb (112 kg)    Physical Exam Vitals and nursing note reviewed.  Constitutional:      General: He is not in acute distress.    Appearance: Normal appearance. He is not ill-appearing, toxic-appearing or diaphoretic.  HENT:     Head: Normocephalic and atraumatic.     Right Ear: External ear normal.     Left Ear: External ear normal.     Nose: Nose normal.      Mouth/Throat:     Mouth: Mucous membranes are moist.     Pharynx: Oropharynx is clear.  Eyes:     General: No scleral icterus.       Right eye: No discharge.        Left eye: No discharge.     Extraocular Movements: Extraocular movements intact.     Conjunctiva/sclera: Conjunctivae normal.     Pupils: Pupils are equal, round, and reactive to light.  Cardiovascular:     Rate and Rhythm: Normal rate and regular rhythm.     Pulses: Normal pulses.     Heart sounds: Normal heart sounds. No murmur heard.  No friction rub. No gallop.   Pulmonary:     Effort: Pulmonary effort is normal. No respiratory distress.     Breath sounds: Normal breath sounds. No stridor. No wheezing, rhonchi or rales.  Chest:     Chest wall: No tenderness.  Musculoskeletal:        General: Normal range of motion.     Cervical back: Normal range of motion and neck supple.  Skin:    General: Skin is warm and dry.     Capillary Refill: Capillary refill takes less than 2 seconds.     Coloration: Skin is not jaundiced or pale.     Findings: No bruising, erythema, lesion or rash.  Neurological:     General: No focal deficit present.     Mental Status: He is alert and oriented to person, place, and time. Mental status is at baseline.  Psychiatric:        Mood and Affect: Mood normal.        Behavior: Behavior normal.        Thought Content: Thought content normal.        Judgment: Judgment normal.     Results for orders placed or performed in visit on 06/13/19  Testosterone, Free, Total, SHBG  Result Value Ref Range   Testosterone 593 264 - 916 ng/dL   Testosterone, Free 11.8 7.2 - 24.0 pg/mL   Sex Hormone Binding 44.2 19.3 - 76.4 nmol/L      Assessment & Plan:   Problem List Items Addressed This Visit      Cardiovascular and Mediastinum   Hypertension    Under good control on current regimen. Continue current regimen. Continue to monitor. Call with any concerns. Refills given. Labs drawn today.         Relevant Medications   amLODipine (NORVASC) 10 MG tablet   benazepril (LOTENSIN) 40 MG tablet   EPINEPHrine (EPIPEN 2-PAK) 0.3 mg/0.3 mL IJ SOAJ injection   Other Relevant Orders   Comprehensive metabolic panel     Other   Hyperlipidemia    Rechecking labs today. Await results.       Relevant Medications   amLODipine (NORVASC) 10 MG tablet   benazepril (LOTENSIN) 40 MG tablet   EPINEPHrine (EPIPEN 2-PAK) 0.3 mg/0.3 mL IJ SOAJ  injection   Other Relevant Orders   Comprehensive metabolic panel   Lipid Panel w/o Chol/HDL Ratio   Depression    Under good control on current regimen. Continue current regimen. Continue to monitor. Call with any concerns. Refills given. Labs drawn today.        Relevant Medications   PARoxetine (PAXIL) 20 MG tablet    Other Visit Diagnoses    Chronic left-sided low back pain without sciatica    -  Primary   In exacerbation. Will treat with flexeril. Obtain x-ray. Treat as needed. Stretches given.    Relevant Medications   cyclobenzaprine (FLEXERIL) 10 MG tablet   Other Relevant Orders   DG Lumbar Spine Complete   Need for hepatitis C screening test       Labs drawn today. Await results.   Relevant Orders   Hepatitis C Antibody       Follow up plan: Return in about 6 months (around 06/29/2020) for Physical.

## 2019-12-31 NOTE — Assessment & Plan Note (Signed)
Under good control on current regimen. Continue current regimen. Continue to monitor. Call with any concerns. Refills given. Labs drawn today.   

## 2019-12-31 NOTE — Assessment & Plan Note (Signed)
Rechecking labs today. Await results.  

## 2019-12-31 NOTE — Patient Instructions (Signed)
Sciatica Rehab Ask your health care provider which exercises are safe for you. Do exercises exactly as told by your health care provider and adjust them as directed. It is normal to feel mild stretching, pulling, tightness, or discomfort as you do these exercises. Stop right away if you feel sudden pain or your pain gets worse. Do not begin these exercises until told by your health care provider. Stretching and range-of-motion exercises These exercises warm up your muscles and joints and improve the movement and flexibility of your hips and back. These exercises also help to relieve pain, numbness, and tingling. Sciatic nerve glide 1. Sit in a chair with your head facing down toward your chest. Place your hands behind your back. Let your shoulders slump forward. 2. Slowly straighten one of your legs while you tilt your head back as if you are looking toward the ceiling. Only straighten your leg as far as you can without making your symptoms worse. 3. Hold this position for __________ seconds. 4. Slowly return to the starting position. 5. Repeat with your other leg. Repeat __________ times. Complete this exercise __________ times a day. Knee to chest with hip adduction and internal rotation  1. Lie on your back on a firm surface with both legs straight. 2. Bend one of your knees and move it up toward your chest until you feel a gentle stretch in your lower back and buttock. Then, move your knee toward the shoulder that is on the opposite side from your leg. This is hip adduction and internal rotation. ? Hold your leg in this position by holding on to the front of your knee. 3. Hold this position for __________ seconds. 4. Slowly return to the starting position. 5. Repeat with your other leg. Repeat __________ times. Complete this exercise __________ times a day. Prone extension on elbows  1. Lie on your abdomen on a firm surface. A bed may be too soft for this exercise. 2. Prop yourself up on  your elbows. 3. Use your arms to help lift your chest up until you feel a gentle stretch in your abdomen and your lower back. ? This will place some of your body weight on your elbows. If this is uncomfortable, try stacking pillows under your chest. ? Your hips should stay down, against the surface that you are lying on. Keep your hip and back muscles relaxed. 4. Hold this position for __________ seconds. 5. Slowly relax your upper body and return to the starting position. Repeat __________ times. Complete this exercise __________ times a day. Strengthening exercises These exercises build strength and endurance in your back. Endurance is the ability to use your muscles for a long time, even after they get tired. Pelvic tilt This exercise strengthens the muscles that lie deep in the abdomen. 1. Lie on your back on a firm surface. Bend your knees and keep your feet flat on the floor. 2. Tense your abdominal muscles. Tip your pelvis up toward the ceiling and flatten your lower back into the floor. ? To help with this exercise, you may place a small towel under your lower back and try to push your back into the towel. 3. Hold this position for __________ seconds. 4. Let your muscles relax completely before you repeat this exercise. Repeat __________ times. Complete this exercise __________ times a day. Alternating arm and leg raises  1. Get on your hands and knees on a firm surface. If you are on a hard floor, you may want to use   padding, such as an exercise mat, to cushion your knees. 2. Line up your arms and legs. Your hands should be directly below your shoulders, and your knees should be directly below your hips. 3. Lift your left leg behind you. At the same time, raise your right arm and straighten it in front of you. ? Do not lift your leg higher than your hip. ? Do not lift your arm higher than your shoulder. ? Keep your abdominal and back muscles tight. ? Keep your hips facing the  ground. ? Do not arch your back. ? Keep your balance carefully, and do not hold your breath. 4. Hold this position for __________ seconds. 5. Slowly return to the starting position. 6. Repeat with your right leg and your left arm. Repeat __________ times. Complete this exercise __________ times a day. Posture and body mechanics Good posture and healthy body mechanics can help to relieve stress in your body's tissues and joints. Body mechanics refers to the movements and positions of your body while you do your daily activities. Posture is part of body mechanics. Good posture means:  Your spine is in its natural S-curve position (neutral).  Your shoulders are pulled back slightly.  Your head is not tipped forward. Follow these guidelines to improve your posture and body mechanics in your everyday activities. Standing   When standing, keep your spine neutral and your feet about hip width apart. Keep a slight bend in your knees. Your ears, shoulders, and hips should line up.  When you do a task in which you stand in one place for a long time, place one foot up on a stable object that is 2-4 inches (5-10 cm) high, such as a footstool. This helps keep your spine neutral. Sitting   When sitting, keep your spine neutral and keep your feet flat on the floor. Use a footrest, if necessary, and keep your thighs parallel to the floor. Avoid rounding your shoulders, and avoid tilting your head forward.  When working at a desk or a computer, keep your desk at a height where your hands are slightly lower than your elbows. Slide your chair under your desk so you are close enough to maintain good posture.  When working at a computer, place your monitor at a height where you are looking straight ahead and you do not have to tilt your head forward or downward to look at the screen. Resting  When lying down and resting, avoid positions that are most painful for you.  If you have pain with activities  such as sitting, bending, stooping, or squatting, lie in a position in which your body does not bend very much. For example, avoid curling up on your side with your arms and knees near your chest (fetal position).  If you have pain with activities such as standing for a long time or reaching with your arms, lie with your spine in a neutral position and bend your knees slightly. Try the following positions: ? Lying on your side with a pillow between your knees. ? Lying on your back with a pillow under your knees. Lifting   When lifting objects, keep your feet at least shoulder width apart and tighten your abdominal muscles.  Bend your knees and hips and keep your spine neutral. It is important to lift using the strength of your legs, not your back. Do not lock your knees straight out.  Always ask for help to lift heavy or awkward objects. This information is not   intended to replace advice given to you by your health care provider. Make sure you discuss any questions you have with your health care provider. Document Revised: 05/26/2018 Document Reviewed: 02/23/2018 Elsevier Patient Education  2020 Elsevier Inc.  

## 2020-01-01 LAB — COMPREHENSIVE METABOLIC PANEL
ALT: 41 IU/L (ref 0–44)
AST: 33 IU/L (ref 0–40)
Albumin/Globulin Ratio: 2.1 (ref 1.2–2.2)
Albumin: 4.7 g/dL (ref 3.8–4.9)
Alkaline Phosphatase: 77 IU/L (ref 44–121)
BUN/Creatinine Ratio: 13 (ref 9–20)
BUN: 12 mg/dL (ref 6–24)
Bilirubin Total: 0.6 mg/dL (ref 0.0–1.2)
CO2: 25 mmol/L (ref 20–29)
Calcium: 9.3 mg/dL (ref 8.7–10.2)
Chloride: 101 mmol/L (ref 96–106)
Creatinine, Ser: 0.95 mg/dL (ref 0.76–1.27)
GFR calc Af Amer: 105 mL/min/{1.73_m2} (ref >59)
GFR calc non Af Amer: 91 mL/min/{1.73_m2} (ref >59)
Globulin, Total: 2.2 g/dL (ref 1.5–4.5)
Glucose: 97 mg/dL (ref 65–99)
Potassium: 4.8 mmol/L (ref 3.5–5.2)
Sodium: 139 mmol/L (ref 134–144)
Total Protein: 6.9 g/dL (ref 6.0–8.5)

## 2020-01-01 LAB — LIPID PANEL W/O CHOL/HDL RATIO
Cholesterol, Total: 276 mg/dL — ABNORMAL HIGH (ref 100–199)
HDL: 51 mg/dL (ref >39)
LDL Chol Calc (NIH): 187 mg/dL — ABNORMAL HIGH (ref 0–99)
Triglycerides: 199 mg/dL — ABNORMAL HIGH (ref 0–149)
VLDL Cholesterol Cal: 38 mg/dL (ref 5–40)

## 2020-01-01 LAB — HEPATITIS C ANTIBODY: Hep C Virus Ab: <0.1 s/co ratio (ref 0.0–0.9)

## 2020-01-02 DIAGNOSIS — Z23 Encounter for immunization: Secondary | ICD-10-CM

## 2020-01-04 ENCOUNTER — Encounter: Payer: Self-pay | Admitting: Family Medicine

## 2020-01-08 ENCOUNTER — Ambulatory Visit
Admission: RE | Admit: 2020-01-08 | Discharge: 2020-01-08 | Disposition: A | Discharge status: Home / Self Care | Payer: 59 | Source: Ambulatory Visit | Attending: Family Medicine | Admitting: Family Medicine

## 2020-01-08 ENCOUNTER — Ambulatory Visit
Admission: RE | Admit: 2020-01-08 | Discharge: 2020-01-08 | Disposition: A | Discharge status: Home / Self Care | Payer: 59 | Attending: Family Medicine | Admitting: Family Medicine

## 2020-01-08 ENCOUNTER — Other Ambulatory Visit: Payer: Self-pay | Admitting: Family Medicine

## 2020-01-08 ENCOUNTER — Other Ambulatory Visit: Payer: Self-pay

## 2020-01-08 DIAGNOSIS — M545 Low back pain, unspecified: Secondary | ICD-10-CM

## 2020-01-08 DIAGNOSIS — M546 Pain in thoracic spine: Secondary | ICD-10-CM

## 2020-01-08 DIAGNOSIS — G8929 Other chronic pain: Secondary | ICD-10-CM | POA: Diagnosis present

## 2020-01-21 ENCOUNTER — Encounter: Payer: Self-pay | Admitting: Family Medicine

## 2020-03-03 ENCOUNTER — Encounter: Payer: Self-pay | Admitting: Family Medicine

## 2020-03-03 ENCOUNTER — Telehealth (INDEPENDENT_AMBULATORY_CARE_PROVIDER_SITE_OTHER): Payer: 59 | Admitting: Family Medicine

## 2020-03-03 DIAGNOSIS — M47816 Spondylosis without myelopathy or radiculopathy, lumbar region: Secondary | ICD-10-CM | POA: Insufficient documentation

## 2020-03-03 MED ORDER — PREDNISONE 10 MG PO TABS: 10.0000 mg | ORAL_TABLET | Freq: Every day | ORAL | 0 refills | Status: DC

## 2020-03-03 MED ORDER — PREDNISONE 10 MG PO TABS: ORAL_TABLET | ORAL | 0 refills | Status: DC

## 2020-03-03 NOTE — Progress Notes (Signed)
There were no vitals taken for this visit.   Subjective:    Patient ID: Anthony Hunter, male    DOB: 04/28/1965, 55 y.o.   MRN: [id number]  HPI: Anthony Hunter is a 55 y.o. male  Chief Complaint  Patient presents with  . Back Pain    Pt states he injured his lower back about 15 years ago, was working out Saturday when he felt it "tweak". States he has had issues on and off for the last 15 years with his back.    BACK PAIN Duration: 15+ years, increased pain 2 days ago Mechanism of injury: lifting Location: R>L, bilateral and low back Onset: sudden Severity: severe Quality: dull throbing Frequency: constant Radiation: none Aggravating factors: nothing Alleviating factors: ibuprofen, muscle relaxer Status: stable Treatments attempted: chiropractry, rest, ice, heat, APAP, ibuprofen, aleve and physical therapy  Relief with NSAIDs?: moderate Nighttime pain:  no Paresthesias / decreased sensation:  no Bowel / bladder incontinence:  no Fevers:  no Dysuria / urinary frequency:  no  Relevant past medical, surgical, family and social history reviewed and updated as indicated. Interim medical history since our last visit reviewed. Allergies and medications reviewed and updated.  Review of Systems  Constitutional: Negative.   HENT: Negative.   Respiratory: Negative.   Cardiovascular: Negative.   Gastrointestinal: Negative.   Musculoskeletal: Positive for back pain and myalgias. Negative for arthralgias, gait problem, joint swelling, neck pain and neck stiffness.  Skin: Negative.   Neurological: Negative.   Psychiatric/Behavioral: Negative.     Per HPI unless specifically indicated above     Objective:    There were no vitals taken for this visit.  Wt Readings from Last 3 Encounters:  12/31/19 258 lb 6.4 oz (117.2 kg)  06/01/19 261 lb 3.2 oz (118.5 kg)  11/23/18 247 lb (112 kg)    Physical Exam Vitals and nursing note reviewed.  Pulmonary:     Effort: Pulmonary effort is  normal. No respiratory distress.     Comments: Speaking in full sentences Neurological:     Mental Status: He is alert.  Psychiatric:        Mood and Affect: Mood normal.        Behavior: Behavior normal.        Thought Content: Thought content normal.        Judgment: Judgment normal.     Results for orders placed or performed in visit on 12/31/19  Comprehensive metabolic panel  Result Value Ref Range   Glucose 97 65 - 99 mg/dL   BUN 12 6 - 24 mg/dL   Creatinine, Ser 0.95 0.76 - 1.27 mg/dL   GFR calc non Af Amer 91 >59 mL/min/1.73   GFR calc Af Amer 105 >59 mL/min/1.73   BUN/Creatinine Ratio 13 9 - 20   Sodium 139 134 - 144 mmol/L   Potassium 4.8 3.5 - 5.2 mmol/L   Chloride 101 96 - 106 mmol/L   CO2 25 20 - 29 mmol/L   Calcium 9.3 8.7 - 10.2 mg/dL   Total Protein 6.9 6.0 - 8.5 g/dL   Albumin 4.7 3.8 - 4.9 g/dL   Globulin, Total 2.2 1.5 - 4.5 g/dL   Albumin/Globulin Ratio 2.1 1.2 - 2.2   Bilirubin Total 0.6 0.0 - 1.2 mg/dL   Alkaline Phosphatase 77 44 - 121 IU/L   AST 33 0 - 40 IU/L   ALT 41 0 - 44 IU/L  Lipid Panel w/o Chol/HDL Ratio  Result Value Ref Range  Cholesterol, Total 276 (H) 100 - 199 mg/dL   Triglycerides 199 (H) 0 - 149 mg/dL   HDL 51 >39 mg/dL   VLDL Cholesterol Cal 38 5 - 40 mg/dL   LDL Chol Calc (NIH) 187 (H) 0 - 99 mg/dL  Hepatitis C Antibody  Result Value Ref Range   Hep C Virus Ab <0.1 0.0 - 0.9 s/co ratio      Assessment & Plan:   Problem List Items Addressed This Visit      Musculoskeletal and Integument   Osteoarthritis of lumbar spine - Primary    Will treat with prednisone, continue muscle relaxer. Has done PT. Will get MRI. Call if not getting better or getting worse.      Relevant Medications   predniSONE (DELTASONE) 10 MG tablet   Other Relevant Orders   MR Lumbar Spine Wo Contrast       Follow up plan: Return if symptoms worsen or fail to improve.    . This visit was completed via telephone due to the restrictions of the  COVID-19 pandemic. All issues as above were discussed and addressed but no physical exam was performed. If it was felt that the patient should be evaluated in the office, they were directed there. The patient verbally consented to this visit. Patient was unable to complete an audio/visual visit due to Technical difficulties. Due to the catastrophic nature of the COVID-19 pandemic, this visit was done through audio contact only. . Location of the patient: home . Location of the provider: home . Those involved with this call:  . Provider: Park Liter, DO . CMA: Yvonna Alanis, [place] . Front Desk/Registration: Jill Side  . Time spent on call: 21 minutes on the phone discussing health concerns. 30 minutes total spent in review of patient's record and preparation of their chart.

## 2020-03-03 NOTE — Assessment & Plan Note (Signed)
Will treat with prednisone, continue muscle relaxer. Has done PT. Will get MRI. Call if not getting better or getting worse.

## 2020-03-06 ENCOUNTER — Telehealth: Payer: Self-pay | Admitting: Family Medicine

## 2020-03-06 NOTE — Telephone Encounter (Signed)
[employer] Denied MRI.  Reason:  Imaging requires six weeks of provider directed treatment to be completed. Supported treatments include (but are not limited to) drugs for swelling or pain, an in office workout (physical therapy), and/or oral or injected steroids. This must have been completed in the past three months without improved symptoms.   You have not completed six weeks of provider directed treatment.  There was no contact with your provider after completing treatment  Please let me know what you would like me to do.

## 2020-03-07 NOTE — Telephone Encounter (Signed)
Called and spoke with patient. He states that he has already done PT. States he was seen at [facility] in [place] around November and December of 2021. States he thinks he had about a visits with them and that PT did not work for him.

## 2020-03-07 NOTE — Telephone Encounter (Signed)
If we can get the records from them we can try to appeal the MRI

## 2020-03-07 NOTE — Telephone Encounter (Signed)
Received a return call from [facility]. Explained the situation and she is faxing over notes on the patient.

## 2020-03-07 NOTE — Telephone Encounter (Signed)
Please make patient aware and ask him if he'd like to do formal PT?

## 2020-03-07 NOTE — Telephone Encounter (Signed)
Called and LVM with [facility] asking for them to please return my call.

## 2020-03-10 NOTE — Telephone Encounter (Signed)
Forms were faxed put in bin to be reviewed.

## 2020-03-10 NOTE — Telephone Encounter (Signed)
[facility] does not have a email address.

## 2020-03-10 NOTE — Telephone Encounter (Signed)
Patient notified that notes have been placed in providers box for review. Will get provider to review.

## 2020-03-10 NOTE — Telephone Encounter (Signed)
Pt is calling and he is aware waiting on office notes [facility] .Pt ask for email address. I informed the patient I do not know email address for [employer]

## 2020-03-10 NOTE — Telephone Encounter (Signed)
Pt called stating that he spoke with [facility] and that they have told him that they have faxed over his notes to provider. Pt is requesting to have an update. Please advise.

## 2020-03-24 ENCOUNTER — Other Ambulatory Visit: Payer: Self-pay

## 2020-03-24 ENCOUNTER — Ambulatory Visit
Admission: RE | Admit: 2020-03-24 | Discharge: 2020-03-24 | Disposition: A | Discharge status: Home / Self Care | Payer: 59 | Source: Ambulatory Visit | Attending: Family Medicine | Admitting: Family Medicine

## 2020-03-24 DIAGNOSIS — M47816 Spondylosis without myelopathy or radiculopathy, lumbar region: Secondary | ICD-10-CM | POA: Diagnosis not present

## 2020-03-29 ENCOUNTER — Encounter: Payer: Self-pay | Admitting: Family Medicine

## 2020-03-29 DIAGNOSIS — M47816 Spondylosis without myelopathy or radiculopathy, lumbar region: Secondary | ICD-10-CM

## 2020-04-08 ENCOUNTER — Ambulatory Visit: Payer: Managed Care, Other (non HMO) | Admitting: Family Medicine

## 2020-05-22 ENCOUNTER — Telehealth: Payer: Self-pay

## 2020-05-22 NOTE — Telephone Encounter (Signed)
He likely needs to talk to his pharmacy about that. If his insurance will not give him 90 days, there is nothing I can do about that unless he pays out of pocket

## 2020-05-22 NOTE — Telephone Encounter (Signed)
Spoke with patient and made him aware of Dr.Johnson's recommendations. Patient verbalized understanding and states he will reach out to his insurance company.

## 2020-05-22 NOTE — Telephone Encounter (Signed)
Routing to provider  

## 2020-05-22 NOTE — Telephone Encounter (Signed)
Copied from [place] [phone]. Topic: Quick Communication - Rx Refill/Question >> May 21, 2020  6:41 PM Loma [place] wrote: Medication: amLODipine (NORVASC) 10 MG tablet 90 tablet 1 12/31/2019    benazepril (LOTENSIN) 40 MG tablet 90 tablet 1 12/31/2019   Sig - Route: Take 1 tablet (40 mg total) by mouth daily. - Oral  Sent to pharmacy as: benazepril (LOTENSIN) 40 MG tablet   PARoxetine (PAXIL) 20 MG tablet 90 tablet 1 12/31/2019   Sig: TAKE 1 TABLET(20 MG) BY MOUTH DAILY  Sent to pharmacy as: PARoxetine (PAXIL) 20 MG tablet   Please contact pt, had spoke with dr wanting 90 day refills before leaving out of the [place] 2 [place]. States he was only on 30 day refills, never gotten 90 but script already reads 90 day?  Wants 90 in full at each refill, Also note due to an insurance change pt is [facility]. [facility]. Pls FU with pt as this has been done as he is leaving Monday and cannot be without for over 2 month CB at [phone]   [id number], [place] [address] [place] [address] Phone: [phone] Fax: [phone] Hours: Not open 24 hours    Agent: Please be advised that RX refills may take up to 3 business days. We ask that you follow-up with your pharmacy.

## 2020-05-23 ENCOUNTER — Telehealth: Payer: Self-pay

## 2020-05-23 NOTE — Telephone Encounter (Signed)
Called [facility] to find out more about prescriptions. They had patients prescriptions transferred to them from [facility]. Stated that he was getting a 30 day supply but that they were able to fill a 90 day supply since the patient was traveling out of the country. Pharmacy states that they have already filled the 90 day supplies for the Benazepril and the Prozac, just waiting on the Amlodipine to be transferred over.   Called and discussed this with the patient. He states he has already picked up the first 2 prescriptions and is just waiting on the third. Thanked me for calling to follow up.

## 2020-05-23 NOTE — Telephone Encounter (Signed)
Copied from [place] [id number]. Topic: General - Other >> May 23, 2020 11:08 AM Tessa Lerner A wrote: Reason for CRM: Patient would like to be contacted by a member of staff when possible  Patient has been working to coordinate picking up their prescriptions.  Patient has previously submitted refill requests and been assured that medications would be ready  Patient has been in contact with [facility] and told that there are no prescriptions ready for them.  Patient will be traveling out of the country Monday morning 05/26/20 and would like to resolve this matter before then  Please contact to further advise when possible

## 2020-07-28 ENCOUNTER — Telehealth (INDEPENDENT_AMBULATORY_CARE_PROVIDER_SITE_OTHER): Payer: Self-pay | Admitting: Vascular Surgery

## 2020-07-28 NOTE — Telephone Encounter (Signed)
Lvm stating that both legs are bleeding and swollen. Patient would like to come in to be seen. Patient was last seen 11/2018 post laser ultrasound. Scheduled patient for consult visit with GS. Patient never had f/u post laser.

## 2020-07-31 ENCOUNTER — Ambulatory Visit (INDEPENDENT_AMBULATORY_CARE_PROVIDER_SITE_OTHER): Payer: 59 | Admitting: Vascular Surgery

## 2020-07-31 ENCOUNTER — Encounter (INDEPENDENT_AMBULATORY_CARE_PROVIDER_SITE_OTHER): Payer: Self-pay | Admitting: Vascular Surgery

## 2020-07-31 ENCOUNTER — Other Ambulatory Visit: Payer: Self-pay

## 2020-07-31 VITALS — BP 124/82 | HR 58 | Ht 73.0 in | Wt 263.0 lb

## 2020-07-31 DIAGNOSIS — I872 Venous insufficiency (chronic) (peripheral): Secondary | ICD-10-CM | POA: Diagnosis not present

## 2020-07-31 DIAGNOSIS — I10 Essential (primary) hypertension: Secondary | ICD-10-CM

## 2020-07-31 DIAGNOSIS — I83812 Varicose veins of left lower extremities with pain: Secondary | ICD-10-CM | POA: Diagnosis not present

## 2020-07-31 DIAGNOSIS — E782 Mixed hyperlipidemia: Secondary | ICD-10-CM
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
## 2020-08-02 ENCOUNTER — Encounter (INDEPENDENT_AMBULATORY_CARE_PROVIDER_SITE_OTHER): Payer: Self-pay | Admitting: Vascular Surgery

## 2020-08-24 ENCOUNTER — Other Ambulatory Visit: Payer: Self-pay | Admitting: Family Medicine

## 2020-08-24 NOTE — Telephone Encounter (Signed)
No future visit

## 2020-11-14 ENCOUNTER — Other Ambulatory Visit: Payer: Self-pay | Admitting: Family Medicine

## 2020-11-14 DIAGNOSIS — F3342 Major depressive disorder, recurrent, in full remission: Secondary | ICD-10-CM

## 2020-11-14 NOTE — Telephone Encounter (Signed)
Courtesy refill. Scheduled pt for 11/21/20

## 2020-11-19 ENCOUNTER — Other Ambulatory Visit: Payer: Self-pay | Admitting: Family Medicine

## 2020-11-21 ENCOUNTER — Ambulatory Visit: Payer: 59 | Admitting: Family Medicine

## 2020-11-25 ENCOUNTER — Ambulatory Visit: Payer: 59 | Admitting: Family Medicine

## 2020-11-26 ENCOUNTER — Other Ambulatory Visit: Payer: Self-pay

## 2020-11-26 ENCOUNTER — Ambulatory Visit (INDEPENDENT_AMBULATORY_CARE_PROVIDER_SITE_OTHER): Payer: 59 | Admitting: Family Medicine

## 2020-11-26 ENCOUNTER — Encounter: Payer: Self-pay | Admitting: Family Medicine

## 2020-11-26 VITALS — BP 125/82 | HR 51 | Temp 98.9°F | Wt 253.6 lb

## 2020-11-26 DIAGNOSIS — G8929 Other chronic pain: Secondary | ICD-10-CM

## 2020-11-26 DIAGNOSIS — M545 Low back pain, unspecified: Secondary | ICD-10-CM

## 2020-11-26 DIAGNOSIS — F3342 Major depressive disorder, recurrent, in full remission: Secondary | ICD-10-CM | POA: Diagnosis not present

## 2020-11-26 DIAGNOSIS — I10 Essential (primary) hypertension: Secondary | ICD-10-CM | POA: Diagnosis not present

## 2020-11-26 DIAGNOSIS — M47816 Spondylosis without myelopathy or radiculopathy, lumbar region: Secondary | ICD-10-CM | POA: Diagnosis not present

## 2020-11-26 MED ORDER — BENAZEPRIL HCL 40 MG PO TABS
40.0000 mg | ORAL_TABLET | Freq: Every day | ORAL | 1 refills | Status: DC
Start: 2020-11-26 — End: 2021-06-01

## 2020-11-26 MED ORDER — AMLODIPINE BESYLATE 10 MG PO TABS
10.0000 mg | ORAL_TABLET | Freq: Every day | ORAL | 1 refills | Status: DC
Start: 2020-11-26 — End: 2021-06-01

## 2020-11-26 MED ORDER — PAROXETINE HCL 20 MG PO TABS: ORAL_TABLET | ORAL | 1 refills | Status: DC

## 2020-11-26 NOTE — Assessment & Plan Note (Signed)
Not doing well, did not see PM&R. Will resend referral. Call with any concerns.

## 2020-11-26 NOTE — Assessment & Plan Note (Signed)
Under good control on current regimen. Continue current regimen. Continue to monitor. Call with any concerns. Refills given. Labs drawn today.   

## 2020-11-26 NOTE — Progress Notes (Signed)
BP 125/82   Pulse (!) 51   Temp 98.9 F (37.2 C) (Oral)   Wt 253 lb 9.6 oz (115 kg)   SpO2 99%   BMI 33.46 kg/m    Subjective:    Patient ID: Anthony Hunter, male    DOB: January 04, 1966, 55 y.o.   MRN: [id number]  HPI: Anthony Hunter is a 55 y.o. male  Chief Complaint  Patient presents with   Hypertension   Depression    Has been doing Chiropractry and physical therapy for his back.   HYPERTENSION Hypertension status: controlled  Satisfied with current treatment? yes Duration of hypertension: chronic BP monitoring frequency:  not checking BP medication side effects:  no Medication compliance: excellent compliance Previous BP meds: benazepril, amlodipine Aspirin: no Recurrent headaches: no Visual changes: no Palpitations: no Dyspnea: no Chest pain: no Lower extremity edema: no Dizzy/lightheaded: no  DEPRESSION Mood status: yes Satisfied with current treatment?: yes Symptom severity: mild  Duration of current treatment : chronic Side effects: no Medication compliance: excellent compliance Psychotherapy/counseling: no  Previous psychiatric medications: paxil Depressed mood: no Anxious mood: no Anhedonia: no Significant weight loss or gain: no Insomnia: no  Fatigue: no Feelings of worthlessness or guilt: no Impaired concentration/indecisiveness: no Suicidal ideations: no Hopelessness: no Crying spells: no Depression screen [facility] 2/9 11/26/2020 12/31/2019 06/01/2019 11/10/2018 05/05/2018  Decreased Interest 0 0 0 0 0  Down, Depressed, Hopeless 0 0 0 0 0  PHQ - 2 Score 0 0 0 0 0  Altered sleeping 1 0 1 1 0  Tired, decreased energy 1 0 1 0 0  Change in appetite 0 0 0 0 0  Feeling bad or failure about yourself  0 0 0 0 0  Trouble concentrating 0 0 0 0 0  Moving slowly or fidgety/restless 0 0 0 0 0  Suicidal thoughts 0 0 0 0 0  PHQ-9 Score 2 0 2 1 0  Difficult doing work/chores Not difficult at all - - - Not difficult at all    Relevant past medical, surgical, family  and social history reviewed and updated as indicated. Interim medical history since our last visit reviewed. Allergies and medications reviewed and updated.  Review of Systems  Constitutional: Negative.   Respiratory: Negative.    Cardiovascular: Negative.   Musculoskeletal: Negative.   Neurological: Negative.   Psychiatric/Behavioral: Negative.     Per HPI unless specifically indicated above     Objective:    BP 125/82   Pulse (!) 51   Temp 98.9 F (37.2 C) (Oral)   Wt 253 lb 9.6 oz (115 kg)   SpO2 99%   BMI 33.46 kg/m   Wt Readings from Last 3 Encounters:  11/26/20 253 lb 9.6 oz (115 kg)  07/31/20 263 lb (119.3 kg)  12/31/19 258 lb 6.4 oz (117.2 kg)    Physical Exam Vitals and nursing note reviewed.  Constitutional:      General: He is not in acute distress.    Appearance: Normal appearance. He is not ill-appearing, toxic-appearing or diaphoretic.  HENT:     Head: Normocephalic and atraumatic.     Right Ear: External ear normal.     Left Ear: External ear normal.     Nose: Nose normal.     Mouth/Throat:     Mouth: Mucous membranes are moist.     Pharynx: Oropharynx is clear.  Eyes:     General: No scleral icterus.       Right eye: No discharge.  Left eye: No discharge.     Extraocular Movements: Extraocular movements intact.     Conjunctiva/sclera: Conjunctivae normal.     Pupils: Pupils are equal, round, and reactive to light.  Cardiovascular:     Rate and Rhythm: Normal rate and regular rhythm.     Pulses: Normal pulses.     Heart sounds: Normal heart sounds. No murmur heard.   No friction rub. No gallop.  Pulmonary:     Effort: Pulmonary effort is normal. No respiratory distress.     Breath sounds: Normal breath sounds. No stridor. No wheezing, rhonchi or rales.  Chest:     Chest wall: No tenderness.  Musculoskeletal:        General: Normal range of motion.     Cervical back: Normal range of motion and neck supple.  Skin:    General: Skin is  warm and dry.     Capillary Refill: Capillary refill takes less than 2 seconds.     Coloration: Skin is not jaundiced or pale.     Findings: No bruising, erythema, lesion or rash.  Neurological:     General: No focal deficit present.     Mental Status: He is alert and oriented to person, place, and time. Mental status is at baseline.  Psychiatric:        Mood and Affect: Mood normal.        Behavior: Behavior normal.        Thought Content: Thought content normal.        Judgment: Judgment normal.    Results for orders placed or performed in visit on 12/31/19  Comprehensive metabolic panel  Result Value Ref Range   Glucose 97 65 - 99 mg/dL   BUN 12 6 - 24 mg/dL   Creatinine, Ser 0.95 0.76 - 1.27 mg/dL   GFR calc non Af Amer 91 >59 mL/min/1.73   GFR calc Af Amer 105 >59 mL/min/1.73   BUN/Creatinine Ratio 13 9 - 20   Sodium 139 134 - 144 mmol/L   Potassium 4.8 3.5 - 5.2 mmol/L   Chloride 101 96 - 106 mmol/L   CO2 25 20 - 29 mmol/L   Calcium 9.3 8.7 - 10.2 mg/dL   Total Protein 6.9 6.0 - 8.5 g/dL   Albumin 4.7 3.8 - 4.9 g/dL   Globulin, Total 2.2 1.5 - 4.5 g/dL   Albumin/Globulin Ratio 2.1 1.2 - 2.2   Bilirubin Total 0.6 0.0 - 1.2 mg/dL   Alkaline Phosphatase 77 44 - 121 IU/L   AST 33 0 - 40 IU/L   ALT 41 0 - 44 IU/L  Lipid Panel w/o Chol/HDL Ratio  Result Value Ref Range   Cholesterol, Total 276 (H) 100 - 199 mg/dL   Triglycerides 199 (H) 0 - 149 mg/dL   HDL 51 >39 mg/dL   VLDL Cholesterol Cal 38 5 - 40 mg/dL   LDL Chol Calc (NIH) 187 (H) 0 - 99 mg/dL  Hepatitis C Antibody  Result Value Ref Range   Hep C Virus Ab <0.1 0.0 - 0.9 s/co ratio      Assessment & Plan:   Problem List Items Addressed This Visit       Cardiovascular and Mediastinum   Hypertension - Primary    Under good control on current regimen. Continue current regimen. Continue to monitor. Call with any concerns. Refills given. Labs drawn today.       Relevant Medications   amLODipine (NORVASC) 10  MG tablet   benazepril (LOTENSIN)  40 MG tablet   Other Relevant Orders   Basic metabolic panel     Musculoskeletal and Integument   Osteoarthritis of lumbar spine    Not doing well, did not see PM&R. Will resend referral. Call with any concerns.       Relevant Orders   Ambulatory referral to Physical Medicine Rehab     Other   Depression    Under good control on current regimen. Continue current regimen. Continue to monitor. Call with any concerns. Refills given. Labs drawn today.       Relevant Medications   PARoxetine (PAXIL) 20 MG tablet   Other Visit Diagnoses     Chronic left-sided low back pain without sciatica       Not doing well, did not see PM&R. Will resend referral. Call with any concerns.    Relevant Medications   PARoxetine (PAXIL) 20 MG tablet   Other Relevant Orders   Ambulatory referral to Physical Medicine Rehab        Follow up plan: Return in about 6 months (around 05/27/2021), or physical.

## 2020-11-27 LAB — BASIC METABOLIC PANEL
BUN/Creatinine Ratio: 10 (ref 9–20)
BUN: 11 mg/dL (ref 6–24)
CO2: 23 mmol/L (ref 20–29)
Calcium: 9.7 mg/dL (ref 8.7–10.2)
Chloride: 103 mmol/L (ref 96–106)
Creatinine, Ser: 1.1 mg/dL (ref 0.76–1.27)
Glucose: 94 mg/dL (ref 70–99)
Potassium: 4 mmol/L (ref 3.5–5.2)
Sodium: 141 mmol/L (ref 134–144)
eGFR: 80 mL/min/{1.73_m2} (ref >59)

## 2020-12-08 ENCOUNTER — Encounter: Payer: Self-pay | Admitting: Family Medicine

## 2020-12-08 ENCOUNTER — Ambulatory Visit: Payer: 59 | Attending: Family Medicine | Admitting: Physical Therapy

## 2020-12-08 DIAGNOSIS — M545 Low back pain, unspecified: Secondary | ICD-10-CM | POA: Insufficient documentation

## 2020-12-08 DIAGNOSIS — G8929 Other chronic pain: Secondary | ICD-10-CM | POA: Insufficient documentation

## 2020-12-08 DIAGNOSIS — M47816 Spondylosis without myelopathy or radiculopathy, lumbar region: Secondary | ICD-10-CM
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
## 2020-12-11 ENCOUNTER — Ambulatory Visit: Payer: 59 | Admitting: Physical Therapy

## 2020-12-15 ENCOUNTER — Encounter: Payer: 59 | Admitting: Physical Therapy

## 2020-12-17 ENCOUNTER — Encounter: Payer: 59 | Admitting: Physical Therapy

## 2020-12-22 ENCOUNTER — Encounter: Payer: 59 | Admitting: Physical Therapy

## 2020-12-24 ENCOUNTER — Encounter: Payer: 59 | Admitting: Physical Therapy

## 2020-12-29 ENCOUNTER — Encounter: Payer: 59 | Admitting: Physical Therapy

## 2020-12-31 ENCOUNTER — Encounter: Payer: 59 | Admitting: Physical Therapy

## 2020-12-31 ENCOUNTER — Encounter: Payer: Self-pay | Admitting: Family Medicine

## 2021-01-05 ENCOUNTER — Encounter: Payer: 59 | Admitting: Physical Therapy

## 2021-01-07 ENCOUNTER — Encounter: Payer: 59 | Admitting: Physical Therapy

## 2021-01-12 ENCOUNTER — Encounter: Payer: 59 | Admitting: Physical Therapy

## 2021-01-14 ENCOUNTER — Encounter: Payer: 59 | Admitting: Physical Therapy

## 2021-03-24 ENCOUNTER — Encounter: Payer: Self-pay | Admitting: *Deleted

## 2021-03-24 ENCOUNTER — Emergency Department: Payer: 59

## 2021-03-24 ENCOUNTER — Other Ambulatory Visit: Payer: Self-pay

## 2021-03-24 ENCOUNTER — Emergency Department
Admission: EM | Admit: 2021-03-24 | Discharge: 2021-03-25 | Disposition: A | Discharge status: Home / Self Care | Payer: 59 | Attending: Emergency Medicine | Admitting: Emergency Medicine

## 2021-03-24 DIAGNOSIS — M7989 Other specified soft tissue disorders: Secondary | ICD-10-CM | POA: Insufficient documentation

## 2021-03-24 DIAGNOSIS — M79602 Pain in left arm: Secondary | ICD-10-CM | POA: Diagnosis not present

## 2021-03-24 DIAGNOSIS — M79601 Pain in right arm: Secondary | ICD-10-CM | POA: Diagnosis not present

## 2021-03-24 DIAGNOSIS — R600 Localized edema: Secondary | ICD-10-CM | POA: Diagnosis not present

## 2021-03-24 DIAGNOSIS — R7982 Elevated C-reactive protein (CRP): Secondary | ICD-10-CM | POA: Insufficient documentation

## 2021-03-24 DIAGNOSIS — M6282 Rhabdomyolysis: Secondary | ICD-10-CM

## 2021-03-24 LAB — CK: Total CK: 6859 U/L — ABNORMAL HIGH (ref 49–397)

## 2021-03-24 LAB — CBC
HCT: 42.9 % (ref 39.0–52.0)
Hemoglobin: 15.1 g/dL (ref 13.0–17.0)
MCH: 31.5 pg (ref 26.0–34.0)
MCHC: 35.2 g/dL (ref 30.0–36.0)
MCV: 89.6 fL (ref 80.0–100.0)
Platelets: 239 10*3/uL (ref 150–400)
RBC: 4.79 MIL/uL (ref 4.22–5.81)
RDW: 12.1 % (ref 11.5–15.5)
WBC: 8.1 10*3/uL (ref 4.0–10.5)
nRBC: 0 % (ref 0.0–0.2)

## 2021-03-24 LAB — URINALYSIS, COMPLETE (UACMP) WITH MICROSCOPIC
Bacteria, UA: NONE SEEN
Bilirubin Urine: NEGATIVE
Glucose, UA: NEGATIVE mg/dL
Hgb urine dipstick: NEGATIVE
Ketones, ur: NEGATIVE mg/dL
Leukocytes,Ua: NEGATIVE
Nitrite: NEGATIVE
Protein, ur: NEGATIVE mg/dL
Specific Gravity, Urine: 1.019 (ref 1.005–1.030)
pH: 5 (ref 5.0–8.0)

## 2021-03-24 LAB — BASIC METABOLIC PANEL
Anion gap: 8 (ref 5–15)
BUN: 14 mg/dL (ref 6–20)
CO2: 21 mmol/L — ABNORMAL LOW (ref 22–32)
Calcium: 8.9 mg/dL (ref 8.9–10.3)
Chloride: 105 mmol/L (ref 98–111)
Creatinine, Ser: 1.05 mg/dL (ref 0.61–1.24)
GFR, Estimated: >60 mL/min (ref >60)
Glucose, Bld: 160 mg/dL — ABNORMAL HIGH (ref 70–99)
Potassium: 3.6 mmol/L (ref 3.5–5.1)
Sodium: 134 mmol/L — ABNORMAL LOW (ref 135–145)

## 2021-03-24 MED ORDER — SODIUM CHLORIDE 0.9 % IV BOLUS
1000.0000 mL | Freq: Once | INTRAVENOUS | Status: AC
  Administered 2021-03-25: 1000 mL via INTRAVENOUS

## 2021-03-24 MED ORDER — DIAZEPAM 2 MG PO TABS: 2.0000 mg | ORAL_TABLET | Freq: Three times a day (TID) | ORAL | 0 refills | Status: DC | PRN

## 2021-03-24 NOTE — ED Provider Notes (Signed)
[facility] Provider Note    Event Date/Time   First MD Initiated Contact with Patient 03/24/21 2133     (approximate)   History   Arm Swelling   HPI  Anthony Hunter is a 56 y.o. male with a history of hypertension hyperlipidemia who comes the ED complaining of bilateral arm swelling.  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  He then took 5 months off to rest, and about 5 days ago went to the gym to workout for the first time since then.  He notes he was only able to do about half of the exertion that he was doing last year, but did not feel that he particularly overdid it. The next day, he had pronounced soreness of bilateral arms.  This resolved over the next 3 days, and he is currently pain-free at rest.  However, today he noticed pronounced swelling of bilateral arms from the shoulder all the way to the hand.  The joints and tissues feel somewhat stiff.  No peripheral coldness or numbness or paresthesia or pain.  Denies any other trauma.  No chest pain or shortness of breath.     Physical Exam   Triage Vital Signs: ED Triage Vitals  Enc Vitals Group     BP 03/24/21 2026 (!) 136/93     Pulse Rate 03/24/21 2026 81     Resp 03/24/21 2026 20     Temp 03/24/21 2026 98.4 F (36.9 C)     Temp Source 03/24/21 2026 Oral     SpO2 03/24/21 2026 95 %     Weight 03/24/21 2023 255 lb (115.7 kg)     Height 03/24/21 2023 6\' 1"  (1.854 m)     Head Circumference --      Peak Flow --      Pain Score 03/24/21 2023 0     Pain Loc --      Pain Edu? --      Excl. in [place]? --     Most recent vital signs: Vitals:   03/24/21 2026  BP: (!) 136/93  Pulse: 81  Resp: 20  Temp: 98.4 F (36.9 C)  SpO2: 95%     General: Awake, no distress.  CV:  Good peripheral perfusion.  Regular rate and rhythm, normal radial pulses and fingertip capillary refill Resp:  Normal effort.  Abd:  No distention.  Other:  Bilateral lower  extremities exhibit diffuse edema most pronounced over the upper arms.  Muscle tendon functions are intact, biceps is strong against resistance.  Compartments are soft.  No inflammatory/infectious soft tissue changes.  No wounds. No facial swelling.  No facial flushing with bilateral arm raise.   ED Results / Procedures / Treatments   Labs (all labs ordered are listed, but only abnormal results are displayed) Labs Reviewed  BASIC METABOLIC PANEL - Abnormal; Notable for the following components:      Result Value   Sodium 134 (*)    CO2 21 (*)    Glucose, Bld 160 (*)    All other components within normal limits  URINALYSIS, COMPLETE (UACMP) WITH MICROSCOPIC - Abnormal; Notable for the following components:   Color, Urine YELLOW (*)    APPearance CLEAR (*)    All other components within normal limits  CK - Abnormal; Notable for the following components:   Total CK 6,859 (*)    All other components within normal limits  CBC     EKG  RADIOLOGY Ultrasound bilateral upper extremities pending    PROCEDURES:  Critical Care performed: No  Procedures   MEDICATIONS ORDERED IN ED: Medications - No data to display   IMPRESSION / MDM / [place] / ED COURSE  I reviewed the triage vital signs and the nursing notes.                              Differential diagnosis includes, but is not limited to, DVT, myositis, hyperkalemia, renal insufficiency    Patient presents with bilateral arm swelling after strenuous workout, symptoms out of proportion from what he is used to.  This may be due to relative deconditioning after 5 months of break from his exercise routine.  Labs show normal chemistry panel, no red cells or myoglobin in the urine, normal blood counts.  CK level is elevated at close to 7000, but for this 115 kg triathlete man, I think this represents a normal physiologic response to strenuous weightlifting workout.  This represents stress-induced myositis  without rhabdomyolysis.  We will obtain ultrasound bilateral upper extremities to evaluate for DVT.  Doubt SVC syndrome, compartment syndrome, arterial occlusion, infection.  Patient is ambulatory, tolerating oral intake, and agreeable with vigorous hydration.     FINAL CLINICAL IMPRESSION(S) / ED DIAGNOSES   Final diagnoses:  Arm swelling     Rx / DC Orders   ED Discharge Orders     None        Note:  This document was prepared using Dragon voice recognition software and may include unintentional dictation errors.   Carrie Mew, MD 03/24/21 2253

## 2021-03-24 NOTE — Discharge Instructions (Addendum)
Drink lots of water over the next 24 hours to maintain vigorous hydration.  Aim to drink enough water that you need to urinate once per hour and urine appears clear and colorless.  You may take Valium as needed for muscle soreness.  Return to the ER for worsening symptoms, persistent vomiting, difficulty breathing or other concerns.

## 2021-03-24 NOTE — ED Provider Notes (Signed)
----------------------------------------- °  11:42 PM on 03/24/2021 -----------------------------------------   BUE [place] negative for DVT. Patient denies pain currently. Agreeable to IV fluids. Will discharge home after completion of IV fluids with prescription for Valium to take as needed.  Advised patient to rest from the gym for 1 week and to follow-up with his PCP.  Strict return precautions given.  Patient verbalizes understanding and agrees with plan of care   Paulette Blanch, MD 03/25/21 [phone]

## 2021-03-24 NOTE — ED Triage Notes (Addendum)
Pt has swelling of both arms.  Pt worked out last week for the first time in months.  Pt reports pain in both arms.   Pt took motrin today without relief.   No chest pain or sob.  Pt alert  speech clear.

## 2021-04-08 ENCOUNTER — Other Ambulatory Visit: Payer: Self-pay | Admitting: Physical Medicine & Rehabilitation

## 2021-04-08 DIAGNOSIS — M546 Pain in thoracic spine: Secondary | ICD-10-CM

## 2021-04-15 ENCOUNTER — Ambulatory Visit
Admission: RE | Admit: 2021-04-15 | Discharge: 2021-04-15 | Disposition: A | Discharge status: Home / Self Care | Payer: 59 | Source: Ambulatory Visit | Attending: Physical Medicine & Rehabilitation | Admitting: Physical Medicine & Rehabilitation

## 2021-04-15 ENCOUNTER — Other Ambulatory Visit: Payer: Self-pay

## 2021-04-15 DIAGNOSIS — M546 Pain in thoracic spine: Secondary | ICD-10-CM | POA: Diagnosis not present

## 2021-06-01 ENCOUNTER — Encounter: Payer: Self-pay | Admitting: Family Medicine

## 2021-06-01 ENCOUNTER — Ambulatory Visit (INDEPENDENT_AMBULATORY_CARE_PROVIDER_SITE_OTHER): Payer: 59 | Admitting: Family Medicine

## 2021-06-01 VITALS — BP 136/87 | HR 60 | Temp 98.2°F | Ht 73.0 in | Wt 263.8 lb

## 2021-06-01 DIAGNOSIS — Z0000 Encounter for general adult medical examination without abnormal findings: Secondary | ICD-10-CM | POA: Diagnosis not present

## 2021-06-01 DIAGNOSIS — F3342 Major depressive disorder, recurrent, in full remission: Secondary | ICD-10-CM | POA: Diagnosis not present

## 2021-06-01 DIAGNOSIS — E782 Mixed hyperlipidemia: Secondary | ICD-10-CM

## 2021-06-01 DIAGNOSIS — H6122 Impacted cerumen, left ear: Secondary | ICD-10-CM

## 2021-06-01 DIAGNOSIS — I10 Essential (primary) hypertension: Secondary | ICD-10-CM

## 2021-06-01 LAB — MICROALBUMIN, URINE WAIVED
Creatinine, Urine Waived: 50 mg/dL (ref 10–300)
Microalb, Ur Waived: 10 mg/L (ref 0–19)
Microalb/Creat Ratio: <30 mg/g (ref <30)

## 2021-06-01 LAB — URINALYSIS, ROUTINE W REFLEX MICROSCOPIC
Bilirubin, UA: NEGATIVE
Glucose, UA: NEGATIVE
Ketones, UA: NEGATIVE
Leukocytes,UA: NEGATIVE
Nitrite, UA: NEGATIVE
Protein,UA: NEGATIVE
RBC, UA: NEGATIVE
Specific Gravity, UA: 1.02 (ref 1.005–1.030)
Urobilinogen, Ur: 0.2 mg/dL (ref 0.2–1.0)
pH, UA: 7 (ref 5.0–7.5)

## 2021-06-01 MED ORDER — BENAZEPRIL HCL 40 MG PO TABS: 40.0000 mg | ORAL_TABLET | Freq: Every day | ORAL | 1 refills | Status: DC

## 2021-06-01 MED ORDER — AMLODIPINE BESYLATE 10 MG PO TABS: 10.0000 mg | ORAL_TABLET | Freq: Every day | ORAL | 1 refills | Status: DC

## 2021-06-01 MED ORDER — OMEPRAZOLE 20 MG PO CPDR: 20.0000 mg | DELAYED_RELEASE_CAPSULE | Freq: Every day | ORAL | 1 refills | Status: DC

## 2021-06-01 MED ORDER — PAROXETINE HCL 20 MG PO TABS: ORAL_TABLET | ORAL | 1 refills | Status: DC

## 2021-06-01 NOTE — Assessment & Plan Note (Signed)
Rechecking labs today. Await results. Treat as needed.  °

## 2021-06-01 NOTE — Assessment & Plan Note (Signed)
Under good control on current regimen. Continue current regimen. Continue to monitor. Call with any concerns. Refills given. Labs drawn today.   
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
## 2021-06-02 LAB — COMPREHENSIVE METABOLIC PANEL
ALT: 33 IU/L (ref 0–44)
AST: 22 IU/L (ref 0–40)
Albumin/Globulin Ratio: 2 (ref 1.2–2.2)
Albumin: 4.7 g/dL (ref 3.8–4.9)
Alkaline Phosphatase: 71 IU/L (ref 44–121)
BUN/Creatinine Ratio: 13 (ref 9–20)
BUN: 12 mg/dL (ref 6–24)
Bilirubin Total: 0.6 mg/dL (ref 0.0–1.2)
CO2: 24 mmol/L (ref 20–29)
Calcium: 9.3 mg/dL (ref 8.7–10.2)
Chloride: 103 mmol/L (ref 96–106)
Creatinine, Ser: 0.94 mg/dL (ref 0.76–1.27)
Globulin, Total: 2.3 g/dL (ref 1.5–4.5)
Glucose: 96 mg/dL (ref 70–99)
Potassium: 4.1 mmol/L (ref 3.5–5.2)
Sodium: 139 mmol/L (ref 134–144)
Total Protein: 7 g/dL (ref 6.0–8.5)
eGFR: 96 mL/min/{1.73_m2} (ref >59)

## 2021-06-02 LAB — CBC WITH DIFFERENTIAL/PLATELET
Basophils Absolute: 0 10*3/uL (ref 0.0–0.2)
Basos: 1 %
EOS (ABSOLUTE): 0.3 10*3/uL (ref 0.0–0.4)
Eos: 6 %
Hematocrit: 43.8 % (ref 37.5–51.0)
Hemoglobin: 15.1 g/dL (ref 13.0–17.7)
Immature Grans (Abs): 0 10*3/uL (ref 0.0–0.1)
Immature Granulocytes: 0 %
Lymphocytes Absolute: 1.9 10*3/uL (ref 0.7–3.1)
Lymphs: 38 %
MCH: 31.3 pg (ref 26.6–33.0)
MCHC: 34.5 g/dL (ref 31.5–35.7)
MCV: 91 fL (ref 79–97)
Monocytes Absolute: 0.4 10*3/uL (ref 0.1–0.9)
Monocytes: 8 %
Neutrophils Absolute: 2.3 10*3/uL (ref 1.4–7.0)
Neutrophils: 47 %
Platelets: 222 10*3/uL (ref 150–450)
RBC: 4.83 x10E6/uL (ref 4.14–5.80)
RDW: 12.8 % (ref 11.6–15.4)
WBC: 4.9 10*3/uL (ref 3.4–10.8)

## 2021-06-02 LAB — TSH: TSH: 5.71 u[IU]/mL — ABNORMAL HIGH (ref 0.450–4.500)

## 2021-06-02 LAB — LIPID PANEL W/O CHOL/HDL RATIO
Cholesterol, Total: 309 mg/dL — ABNORMAL HIGH (ref 100–199)
HDL: 50 mg/dL (ref >39)
LDL Chol Calc (NIH): 193 mg/dL — ABNORMAL HIGH (ref 0–99)
Triglycerides: 333 mg/dL — ABNORMAL HIGH (ref 0–149)
VLDL Cholesterol Cal: 66 mg/dL — ABNORMAL HIGH (ref 5–40)

## 2021-06-02 LAB — PSA: Prostate Specific Ag, Serum: 1.6 ng/mL (ref 0.0–4.0)

## 2021-10-12 ENCOUNTER — Other Ambulatory Visit: Payer: Self-pay | Admitting: Family Medicine

## 2021-10-12 DIAGNOSIS — F3342 Major depressive disorder, recurrent, in full remission: Secondary | ICD-10-CM

## 2021-10-12 NOTE — Telephone Encounter (Signed)
Requested Prescriptions  Pending Prescriptions Disp Refills  . benazepril (LOTENSIN) 40 MG tablet [Pharmacy Med Name: BENAZEPRIL HCL 40 MG TABLET] 90 tablet 0    Sig: TAKE 1 TABLET BY MOUTH EVERY DAY     Cardiovascular:  ACE Inhibitors Passed - 10/12/2021 12:50 AM      Passed - Cr in normal range and within 180 days    Creatinine  Date Value Ref Range Status  04/12/2013 0.94 0.60 - 1.30 mg/dL Final   Creatinine, Ser  Date Value Ref Range Status  06/01/2021 0.94 0.76 - 1.27 mg/dL Final         Passed - K in normal range and within 180 days    Potassium  Date Value Ref Range Status  06/01/2021 4.1 3.5 - 5.2 mmol/L Final  04/12/2013 4.7 3.5 - 5.1 mmol/L Final         Passed - Patient is not pregnant      Passed - Last BP in normal range    BP Readings from Last 1 Encounters:  06/01/21 136/87         Passed - Valid encounter within last 6 months    Recent Outpatient Visits          4 months ago Routine general medical examination at a health care facility   [facility], [place], DO   10 months ago Primary hypertension   [place], [name], DO   1 year ago Osteoarthritis of lumbar spine, unspecified spinal osteoarthritis complication status   [facility], [name], DO   1 year ago Chronic left-sided low back pain without sciatica   [place], [name], DO   2 years ago Routine general medical examination at a health care facility   [facility], [place], DO      Future Appointments            In 1 month [place], [name], DO [place], [name]           . amLODipine ([place]) 10 MG tablet [Pharmacy Med Name: AMLODIPINE BESYLATE 10 MG TAB] 90 tablet 0    Sig: TAKE 1 TABLET BY MOUTH EVERY DAY     Cardiovascular: Calcium Channel Blockers 2 Passed - 10/12/2021 12:50 AM      Passed - Last BP in normal range    BP Readings from Last 1 Encounters:   06/01/21 136/87         Passed - Last Heart Rate in normal range    Pulse Readings from Last 1 Encounters:  06/01/21 60         Passed - Valid encounter within last 6 months    Recent Outpatient Visits          4 months ago Routine general medical examination at a health care facility   [facility], [place], DO   10 months ago Primary hypertension   [place], [name], DO   1 year ago Osteoarthritis of lumbar spine, unspecified spinal osteoarthritis complication status   [place], DO   1 year ago Chronic left-sided low back pain without sciatica   [place], DO   2 years ago Routine general medical examination at a health care facility   [place], Barb Merino, DO      Future Appointments  In 1 month Johnson, Barb Merino, DO [employer], [name]           . omeprazole (PRILOSEC) 20 MG capsule [Pharmacy Med Name: OMEPRAZOLE DR 20 MG CAPSULE] 90 capsule 0    Sig: TAKE 1 CAPSULE BY MOUTH EVERY DAY     Gastroenterology: Proton Pump Inhibitors Passed - 10/12/2021 12:50 AM      Passed - Valid encounter within last 12 months    Recent Outpatient Visits          4 months ago Routine general medical examination at a health care facility   [facility], [place], DO   10 months ago Primary hypertension   [place], [name], DO   1 year ago Osteoarthritis of lumbar spine, unspecified spinal osteoarthritis complication status   [place], DO   1 year ago Chronic left-sided low back pain without sciatica   [place], [name], DO   2 years ago Routine general medical examination at a health care facility   [place], [place], DO      Future Appointments            In 1 month Wynetta Emery, Barb Merino, DO [place], [place]           . PARoxetine (PAXIL) 20 MG tablet [Pharmacy Med Name: PAROXETINE HCL 20 MG TABLET] 90 tablet 0    Sig: TAKE 1 TABLET BY MOUTH EVERY DAY     Psychiatry:  Antidepressants - SSRI Passed - 10/12/2021 12:50 AM      Passed - Completed PHQ-2 or PHQ-9 in the last 360 days      Passed - Valid encounter within last 6 months    Recent Outpatient Visits          4 months ago Routine general medical examination at a health care facility   [facility], [place], DO   10 months ago Primary hypertension   [place], [name], DO   1 year ago Osteoarthritis of lumbar spine, unspecified spinal osteoarthritis complication status   [place], DO   1 year ago Chronic left-sided low back pain without sciatica   [place], [name], DO   2 years ago Routine general medical examination at a health care facility   [place], [place], DO      Future Appointments            In 1 month Wynetta Emery, Barb Merino, DO [facility], [place]

## 2021-12-04 ENCOUNTER — Ambulatory Visit: Payer: 59 | Admitting: Family Medicine

## 2021-12-14 ENCOUNTER — Encounter (INDEPENDENT_AMBULATORY_CARE_PROVIDER_SITE_OTHER): Payer: Self-pay

## 2021-12-22 ENCOUNTER — Encounter: Payer: Self-pay | Admitting: Family Medicine

## 2021-12-22 ENCOUNTER — Ambulatory Visit: Payer: 59 | Admitting: Family Medicine

## 2021-12-22 VITALS — BP 127/86 | HR 61 | Temp 98.2°F | Wt 265.4 lb

## 2021-12-22 DIAGNOSIS — Z23 Encounter for immunization: Secondary | ICD-10-CM | POA: Diagnosis not present

## 2021-12-22 DIAGNOSIS — F3342 Major depressive disorder, recurrent, in full remission: Secondary | ICD-10-CM

## 2021-12-22 DIAGNOSIS — I10 Essential (primary) hypertension: Secondary | ICD-10-CM | POA: Diagnosis not present

## 2021-12-22 DIAGNOSIS — K4090 Unilateral inguinal hernia, without obstruction or gangrene, not specified as recurrent: Secondary | ICD-10-CM | POA: Diagnosis not present

## 2021-12-22 DIAGNOSIS — R052 Subacute cough: Secondary | ICD-10-CM

## 2021-12-22 DIAGNOSIS — E782 Mixed hyperlipidemia: Secondary | ICD-10-CM | POA: Diagnosis not present

## 2021-12-22 MED ORDER — PREDNISONE 50 MG PO TABS
50.0000 mg | ORAL_TABLET | Freq: Every day | ORAL | 0 refills | Status: DC
Start: 2021-12-22 — End: 2022-01-01

## 2021-12-22 MED ORDER — BENAZEPRIL HCL 40 MG PO TABS: 40.0000 mg | ORAL_TABLET | Freq: Every day | ORAL | 1 refills | Status: DC

## 2021-12-22 MED ORDER — AMLODIPINE BESYLATE 10 MG PO TABS: 10.0000 mg | ORAL_TABLET | Freq: Every day | ORAL | 1 refills | Status: DC

## 2021-12-22 MED ORDER — PAROXETINE HCL 20 MG PO TABS: 20.0000 mg | ORAL_TABLET | Freq: Every day | ORAL | 1 refills | Status: DC

## 2021-12-22 MED ORDER — ALBUTEROL SULFATE HFA 108 (90 BASE) MCG/ACT IN AERS: 2.0000 | INHALATION_SPRAY | Freq: Four times a day (QID) | RESPIRATORY_TRACT | 0 refills | Status: DC | PRN

## 2021-12-22 NOTE — Assessment & Plan Note (Signed)
Under good control on current regimen. Continue current regimen. Continue to monitor. Call with any concerns. Refills given. Labs drawn today.   

## 2021-12-22 NOTE — Assessment & Plan Note (Signed)
Rechecking labs today. Await results. Treat as needed.  °

## 2021-12-22 NOTE — Progress Notes (Signed)
BP 127/86   Pulse 61   Temp 98.2 F (36.8 C)   Wt 265 lb 6.4 oz (120.4 kg)   SpO2 98%   BMI 35.02 kg/m    Subjective:    Patient ID: Anthony Hunter, male    DOB: [birth year], 56 y.o.   MRN: [id number]  HPI: Anthony Hunter is a 56 y.o. male  Chief Complaint  Patient presents with   Hypertension   Hyperlipidemia   Depression   Groin Swelling    Patient states he has been having pain in his scrotum for about 6 months, pain is worse to the touch.    Cough    Patient states he was positive for COVID 3 weeks ago, still has a lingering cough and post nasal drip    HYPERTENSION / HYPERLIPIDEMIA Satisfied with current treatment? yes Duration of hypertension: chronic BP monitoring frequency: not checking BP medication side effects: no Past BP meds: amlodipine, benazepril Duration of hyperlipidemia: chronic Cholesterol medication side effects: no Cholesterol supplements: fish oil Past cholesterol medications: none Medication compliance: excellent compliance Aspirin: no Recent stressors: no Recurrent headaches: no Visual changes: no Palpitations: no Dyspnea: no Chest pain: no Lower extremity edema: no Dizzy/lightheaded: no  DEPRESSION Mood status: controlled Satisfied with current treatment?: yes Symptom severity: mild  Duration of current treatment : chronic Side effects: no Medication compliance: excellent compliance Psychotherapy/counseling: no  Previous psychiatric medications: paxil Depressed mood: no Anxious mood: no Anhedonia: no Significant weight loss or gain: no Insomnia: no  Fatigue: no Feelings of worthlessness or guilt: no Impaired concentration/indecisiveness: no Suicidal ideations: no Hopelessness: no Crying spells: no    12/22/2021    8:30 AM 06/01/2021    9:31 AM 11/26/2020    8:58 AM 12/31/2019    9:28 AM 06/01/2019    2:56 PM  Depression screen PHQ 2/9  Decreased Interest 0 0 0 0 0  Down, Depressed, Hopeless 0 0 0 0 0  PHQ - 2 Score 0 0 0 0 0   Altered sleeping 1 0 1 0 1  Tired, decreased energy 0 0 1 0 1  Change in appetite 0 0 0 0 0  Feeling bad or failure about yourself  0 0 0 0 0  Trouble concentrating 0 0 0 0 0  Moving slowly or fidgety/restless 0 0 0 0 0  Suicidal thoughts 0 0 0 0 0  PHQ-9 Score 1 0 2 0 2  Difficult doing work/chores Not difficult at all  Not difficult at all     Has been having some increased swelling in the R side of his groin and his scrotum. He notes that it has been warmer than the rest of his body and has been hurting occasionally when he touches it.  COUGH Duration: 3 weeks Circumstances of initial development of cough: covid Cough severity: moderate Cough description: productive Aggravating factors:  exercise Alleviating factors: nothing Status:  stable Treatments attempted: cold/sinus, mucinex, and cough syrup Wheezing: no Shortness of breath: yes Chest pain: no Chest tightness:no Nasal congestion: yes Runny nose: no Postnasal drip: yes Frequent throat clearing or swallowing: yes Hemoptysis: no Fevers: no Night sweats: no Weight loss: no Heartburn: no Recent foreign travel: no Tuberculosis contacts: no  Relevant past medical, surgical, family and social history reviewed and updated as indicated. Interim medical history since our last visit reviewed. Allergies and medications reviewed and updated.  Review of Systems  Constitutional: Negative.   HENT: Negative.    Respiratory:  Positive for cough, chest  tightness, shortness of breath and wheezing. Negative for apnea, choking and stridor.   Cardiovascular: Negative.   Gastrointestinal: Negative.   Musculoskeletal: Negative.   Neurological: Negative.   Psychiatric/Behavioral: Negative.      Per HPI unless specifically indicated above     Objective:    BP 127/86   Pulse 61   Temp 98.2 F (36.8 C)   Wt 265 lb 6.4 oz (120.4 kg)   SpO2 98%   BMI 35.02 kg/m   Wt Readings from Last 3 Encounters:  12/22/21 265 lb 6.4 oz  (120.4 kg)  06/01/21 263 lb 12.8 oz (119.7 kg)  03/24/21 255 lb (115.7 kg)    Physical Exam Vitals and nursing note reviewed.  Constitutional:      General: He is not in acute distress.    Appearance: Normal appearance. He is not ill-appearing, toxic-appearing or diaphoretic.  HENT:     Head: Normocephalic and atraumatic.     Right Ear: Tympanic membrane and external ear normal.     Left Ear: Tympanic membrane and external ear normal.     Nose: Congestion and rhinorrhea present.     Mouth/Throat:     Mouth: Mucous membranes are moist.     Pharynx: Oropharynx is clear. No oropharyngeal exudate or posterior oropharyngeal erythema.  Eyes:     General: No scleral icterus.       Right eye: No discharge.        Left eye: No discharge.     Extraocular Movements: Extraocular movements intact.     Conjunctiva/sclera: Conjunctivae normal.     Pupils: Pupils are equal, round, and reactive to light.  Cardiovascular:     Rate and Rhythm: Normal rate and regular rhythm.     Pulses: Normal pulses.     Heart sounds: Normal heart sounds. No murmur heard.    No friction rub. No gallop.  Pulmonary:     Effort: Pulmonary effort is normal. No respiratory distress.     Breath sounds: Normal breath sounds. No stridor. No wheezing, rhonchi or rales.  Chest:     Chest wall: No tenderness.  Abdominal:     Hernia: A hernia is present. Hernia is present in the right inguinal area.  Genitourinary:    Penis: Normal and uncircumcised.      Testes: Normal.  Musculoskeletal:        General: Normal range of motion.     Cervical back: Normal range of motion and neck supple.  Skin:    General: Skin is warm and dry.     Capillary Refill: Capillary refill takes less than 2 seconds.     Coloration: Skin is not jaundiced or pale.     Findings: No bruising, erythema, lesion or rash.  Neurological:     General: No focal deficit present.     Mental Status: He is alert and oriented to person, place, and time.  Mental status is at baseline.  Psychiatric:        Mood and Affect: Mood normal.        Behavior: Behavior normal.        Thought Content: Thought content normal.        Judgment: Judgment normal.     Results for orders placed or performed in visit on 06/01/21  Comprehensive metabolic panel  Result Value Ref Range   Glucose 96 70 - 99 mg/dL   BUN 12 6 - 24 mg/dL   Creatinine, Ser 0.94 0.76 - 1.27 mg/dL  eGFR 96 >59 mL/min/1.73   BUN/Creatinine Ratio 13 9 - 20   Sodium 139 134 - 144 mmol/L   Potassium 4.1 3.5 - 5.2 mmol/L   Chloride 103 96 - 106 mmol/L   CO2 24 20 - 29 mmol/L   Calcium 9.3 8.7 - 10.2 mg/dL   Total Protein 7.0 6.0 - 8.5 g/dL   Albumin 4.7 3.8 - 4.9 g/dL   Globulin, Total 2.3 1.5 - 4.5 g/dL   Albumin/Globulin Ratio 2.0 1.2 - 2.2   Bilirubin Total 0.6 0.0 - 1.2 mg/dL   Alkaline Phosphatase 71 44 - 121 IU/L   AST 22 0 - 40 IU/L   ALT 33 0 - 44 IU/L  CBC with Differential/Platelet  Result Value Ref Range   WBC 4.9 3.4 - 10.8 x10E3/uL   RBC 4.83 4.14 - 5.80 x10E6/uL   Hemoglobin 15.1 13.0 - 17.7 g/dL   Hematocrit 43.8 37.5 - 51.0 %   MCV 91 79 - 97 fL   MCH 31.3 26.6 - 33.0 pg   MCHC 34.5 31.5 - 35.7 g/dL   RDW 12.8 11.6 - 15.4 %   Platelets 222 150 - 450 x10E3/uL   Neutrophils 47 Not Estab. %   Lymphs 38 Not Estab. %   Monocytes 8 Not Estab. %   Eos 6 Not Estab. %   Basos 1 Not Estab. %   Neutrophils Absolute 2.3 1.4 - 7.0 x10E3/uL   Lymphocytes Absolute 1.9 0.7 - 3.1 x10E3/uL   Monocytes Absolute 0.4 0.1 - 0.9 x10E3/uL   EOS (ABSOLUTE) 0.3 0.0 - 0.4 x10E3/uL   Basophils Absolute 0.0 0.0 - 0.2 x10E3/uL   Immature Granulocytes 0 Not Estab. %   Immature Grans (Abs) 0.0 0.0 - 0.1 x10E3/uL  Lipid Panel w/o Chol/HDL Ratio  Result Value Ref Range   Cholesterol, Total 309 (H) 100 - 199 mg/dL   Triglycerides 333 (H) 0 - 149 mg/dL   HDL 50 >39 mg/dL   VLDL Cholesterol Cal 66 (H) 5 - 40 mg/dL   LDL Chol Calc (NIH) 193 (H) 0 - 99 mg/dL   Lipid Comment:  Comment   PSA  Result Value Ref Range   Prostate Specific Ag, Serum 1.6 0.0 - 4.0 ng/mL  TSH  Result Value Ref Range   TSH 5.710 (H) 0.450 - 4.500 uIU/mL  Urinalysis, Routine w reflex microscopic  Result Value Ref Range   Specific Gravity, UA 1.020 1.005 - 1.030   pH, UA 7.0 5.0 - 7.5   Color, UA Yellow Yellow   Appearance Ur Clear Clear   Leukocytes,UA Negative Negative   Protein,UA Negative Negative/Trace   Glucose, UA Negative Negative   Ketones, UA Negative Negative   RBC, UA Negative Negative   Bilirubin, UA Negative Negative   Urobilinogen, Ur 0.2 0.2 - 1.0 mg/dL   Nitrite, UA Negative Negative  Microalbumin, Urine Waived  Result Value Ref Range   Microalb, Ur Waived 10 0 - 19 mg/L   Creatinine, Urine Waived 50 10 - 300 mg/dL   Microalb/Creat Ratio <30 <30 mg/g      Assessment & Plan:   Problem List Items Addressed This Visit       Cardiovascular and Mediastinum   Hypertension - Primary    Under good control on current regimen. Continue current regimen. Continue to monitor. Call with any concerns. Refills given. Labs drawn today.        Relevant Medications   amLODipine (NORVASC) 10 MG tablet   benazepril (LOTENSIN) 40 MG  tablet   Other Relevant Orders   Comprehensive metabolic panel   CBC with Differential/Platelet     Other   Hyperlipidemia    Rechecking labs today. Await results. Treat as needed.       Relevant Medications   amLODipine (NORVASC) 10 MG tablet   benazepril (LOTENSIN) 40 MG tablet   Other Relevant Orders   Comprehensive metabolic panel   Lipid Panel w/o Chol/HDL Ratio   CBC with Differential/Platelet   Depression    Under good control on current regimen. Continue current regimen. Continue to monitor. Call with any concerns. Refills given. Labs drawn today.       Relevant Medications   PARoxetine (PAXIL) 20 MG tablet   Other Relevant Orders   Comprehensive metabolic panel   CBC with Differential/Platelet   Other Visit Diagnoses      Right inguinal hernia       Will get him into general surgery for evaluation. Await their input.   Relevant Orders   Ambulatory referral to General Surgery   Subacute cough       Will treat with burst of prednisone and albuterol. Call if not getting better or getting worse.   Need for influenza vaccination       Flu shot given today.   Relevant Orders   Flu Vaccine QUAD 6+ mos PF IM (Fluarix Quad PF) (Completed)        Follow up plan: Return in about 6 months (around 06/22/2022) for physical.

## 2021-12-23 LAB — COMPREHENSIVE METABOLIC PANEL
ALT: 26 IU/L (ref 0–44)
AST: 24 IU/L (ref 0–40)
Albumin/Globulin Ratio: 2.1 (ref 1.2–2.2)
Albumin: 4.6 g/dL (ref 3.8–4.9)
Alkaline Phosphatase: 76 IU/L (ref 44–121)
BUN/Creatinine Ratio: 9 (ref 9–20)
BUN: 9 mg/dL (ref 6–24)
Bilirubin Total: 0.5 mg/dL (ref 0.0–1.2)
CO2: 23 mmol/L (ref 20–29)
Calcium: 9.4 mg/dL (ref 8.7–10.2)
Chloride: 102 mmol/L (ref 96–106)
Creatinine, Ser: 1 mg/dL (ref 0.76–1.27)
Globulin, Total: 2.2 g/dL (ref 1.5–4.5)
Glucose: 95 mg/dL (ref 70–99)
Potassium: 4.2 mmol/L (ref 3.5–5.2)
Sodium: 140 mmol/L (ref 134–144)
Total Protein: 6.8 g/dL (ref 6.0–8.5)
eGFR: 89 mL/min/{1.73_m2} (ref >59)

## 2021-12-23 LAB — CBC WITH DIFFERENTIAL/PLATELET
Basophils Absolute: 0 10*3/uL (ref 0.0–0.2)
Basos: 1 %
EOS (ABSOLUTE): 0.2 10*3/uL (ref 0.0–0.4)
Eos: 3 %
Hematocrit: 42.8 % (ref 37.5–51.0)
Hemoglobin: 14.6 g/dL (ref 13.0–17.7)
Immature Grans (Abs): 0 10*3/uL (ref 0.0–0.1)
Immature Granulocytes: 0 %
Lymphocytes Absolute: 1.7 10*3/uL (ref 0.7–3.1)
Lymphs: 30 %
MCH: 31.9 pg (ref 26.6–33.0)
MCHC: 34.1 g/dL (ref 31.5–35.7)
MCV: 93 fL (ref 79–97)
Monocytes Absolute: 0.4 10*3/uL (ref 0.1–0.9)
Monocytes: 8 %
Neutrophils Absolute: 3.4 10*3/uL (ref 1.4–7.0)
Neutrophils: 58 %
Platelets: 260 10*3/uL (ref 150–450)
RBC: 4.58 x10E6/uL (ref 4.14–5.80)
RDW: 12.5 % (ref 11.6–15.4)
WBC: 5.8 10*3/uL (ref 3.4–10.8)

## 2021-12-23 LAB — LIPID PANEL W/O CHOL/HDL RATIO
Cholesterol, Total: 250 mg/dL — ABNORMAL HIGH (ref 100–199)
HDL: 48 mg/dL (ref >39)
LDL Chol Calc (NIH): 153 mg/dL — ABNORMAL HIGH (ref 0–99)
Triglycerides: 264 mg/dL — ABNORMAL HIGH (ref 0–149)
VLDL Cholesterol Cal: 49 mg/dL — ABNORMAL HIGH (ref 5–40)

## 2022-01-01 ENCOUNTER — Encounter: Payer: Self-pay | Admitting: Surgery

## 2022-01-01 ENCOUNTER — Ambulatory Visit: Payer: 59 | Admitting: Surgery

## 2022-01-01 VITALS — BP 127/89 | HR 65 | Temp 97.9°F | Ht 73.0 in | Wt 267.0 lb

## 2022-01-01 DIAGNOSIS — K4090 Unilateral inguinal hernia, without obstruction or gangrene, not specified as recurrent: Secondary | ICD-10-CM | POA: Diagnosis not present

## 2022-01-01 DIAGNOSIS — K429 Umbilical hernia without obstruction or gangrene: Secondary | ICD-10-CM | POA: Diagnosis not present
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
## 2022-01-01 NOTE — Patient Instructions (Signed)
Our surgery scheduler Pamala Hurry will call you within 24-48 hours to get you scheduled. If you have not heard from her after 48 hours, please call our office. Have the blue sheet available when she calls to write down important information.   If you have any concerns or questions, please feel free to call our  office.   Inguinal Hernia, Adult An inguinal hernia develops when fat or the intestines push through a weak spot in a muscle where the leg meets the lower abdomen (groin). This creates a bulge. This kind of hernia could also be: In the scrotum, if you are male. In folds of skin around the vagina, if you are male. There are three types of inguinal hernias: Hernias that can be pushed back into the abdomen (are reducible). This type rarely causes pain. Hernias that are not reducible (are incarcerated). Hernias that are not reducible and lose their blood supply (are strangulated). This type of hernia requires emergency surgery. What are the causes? This condition is caused by having a weak spot in the muscles or tissues in your groin. This develops over time. The hernia may poke through the weak spot when you suddenly strain your lower abdominal muscles, such as when you: Lift a heavy object. Strain to have a bowel movement. Constipation can lead to straining. Cough. What increases the risk? This condition is more likely to develop in: Males. Pregnant females. People who: Are overweight. Work in jobs that require long periods of standing or heavy lifting. Have had an inguinal hernia before. Smoke or have lung disease. These factors can lead to long-term (chronic) coughing. What are the signs or symptoms? Symptoms may depend on the size of the hernia. Often, a small inguinal hernia has no symptoms. Symptoms of a larger hernia may include: A bulge in the groin area. This is easier to see when standing. It might not be visible when lying down. Pain or burning in the groin. This may get  worse when lifting, straining, or coughing. A dull ache or a feeling of pressure in the groin. An unusual bulge in the scrotum, in males. Symptoms of a strangulated inguinal hernia may include: A bulge in your groin that is very painful and tender to the touch. A bulge that turns red or purple. Fever, nausea, and vomiting. Inability to have a bowel movement or to pass gas. How is this diagnosed? This condition is diagnosed based on your symptoms, your medical history, and a physical exam. Your health care provider may feel your groin area and ask you to cough. How is this treated? Treatment depends on the size of your hernia and whether you have symptoms. If you do not have symptoms, your health care provider may have you watch your hernia carefully and have you come in for follow-up visits. If your hernia is large or if you have symptoms, you may need surgery to repair the hernia. Follow these instructions at home: Lifestyle Avoid lifting heavy objects. Avoid standing for long periods of time. Do not use any products that contain nicotine or tobacco. These products include cigarettes, chewing tobacco, and vaping devices, such as e-cigarettes. If you need help quitting, ask your health care provider. Maintain a healthy weight. Preventing constipation You may need to take these actions to prevent or treat constipation: Drink enough fluid to keep your urine pale yellow. Take over-the-counter or prescription medicines. Eat foods that are high in fiber, such as beans, whole grains, and fresh fruits and vegetables. Limit foods that  are high in fat and processed sugars, such as fried or sweet foods. General instructions You may try to push the hernia back in place by very gently pressing on it while lying down. Do not try to force the bulge back in if it will not push in easily. Watch your hernia for any changes in shape, size, or color. Get help right away if you notice any changes. Take  over-the-counter and prescription medicines only as told by your health care provider. Keep all follow-up visits. This is important. Contact a health care provider if: You have a fever or chills. You develop new symptoms. Your symptoms get worse. Get help right away if: You have pain in your groin that suddenly gets worse. You have a bulge in your groin that: Suddenly gets bigger and does not get smaller. Becomes red or purple or painful to the touch. You are a man and you have a sudden pain in your scrotum, or the size of your scrotum suddenly changes. You cannot push the hernia back in place by very gently pressing on it when you are lying down. You have nausea or vomiting that does not go away. You have a fast heartbeat. You cannot have a bowel movement or pass gas. These symptoms may represent a serious problem that is an emergency. Do not wait to see if the symptoms will go away. Get medical help right away. Call your local emergency services (911 in the [place] Summary An inguinal hernia develops when fat or the intestines push through a weak spot in a muscle where your leg meets your lower abdomen (groin). This condition is caused by having a weak spot in muscles or tissues in your groin. Symptoms may depend on the size of the hernia, and they may include pain or swelling in your groin. A small inguinal hernia often has no symptoms. Treatment may not be needed if you do not have symptoms. If you have symptoms or a large hernia, you may need surgery to repair the hernia. Avoid lifting heavy objects. Also, avoid standing for long periods of time. This information is not intended to replace advice given to you by your health care provider. Make sure you discuss any questions you have with your health care provider. Document Revised: 10/02/2019 Document Reviewed: 10/02/2019 Elsevier Patient Education  [place].

## 2022-01-04 ENCOUNTER — Telehealth: Payer: Self-pay | Admitting: Surgery

## 2022-01-04 NOTE — Telephone Encounter (Signed)
Patient calls back, he is now informed of all dates regarding his surgery.   

## 2022-01-04 NOTE — Telephone Encounter (Signed)
Left message for patient to call, please inform of the following regarding scheduled surgery.   Pre-Admission date/time, and Surgery date at [facility].  Surgery Date: 01/14/22 Preadmission Testing Date: 01/11/22 (phone 1p-5p)  Also patient will need to call at [phone], between 1-3:00pm the day before surgery, to find out what time to arrive for surgery.

## 2022-01-11 ENCOUNTER — Encounter
Admission: RE | Admit: 2022-01-11 | Discharge: 2022-01-11 | Disposition: A | Discharge status: Home / Self Care | Payer: 59 | Source: Ambulatory Visit | Attending: Surgery | Admitting: Surgery

## 2022-01-11 VITALS — Ht 73.0 in | Wt 260.0 lb

## 2022-01-11 DIAGNOSIS — I10 Essential (primary) hypertension: Secondary | ICD-10-CM

## 2022-01-11 HISTORY — DX: Supraventricular tachycardia, unspecified: I47.10

## 2022-01-11 NOTE — Patient Instructions (Addendum)
Your procedure is scheduled on: Thursday January 14, 2022. Report to Day Surgery inside [facility] 2nd floor, stop by registration desk before getting on elevator.  To find out your arrival time please call [phone] between 1PM - 3PM on Wednesday January 13, 2022.  Remember: Instructions that are not followed completely may result in serious medical risk,  up to and including death, or upon the discretion of your surgeon and anesthesiologist your  surgery may need to be rescheduled.     _X__ 1. Do not eat food or drink fluids after midnight the night before your procedure.                 No chewing gum or hard candies. You may drink clear liquids up to 2 hours                 before you are scheduled to arrive for your surgery- DO not drink clear                 liquids within 2 hours of the start of your surgery.                 Clear Liquids include:  water, apple juice without pulp, clear Gatorade, G2 or                  Gatorade Zero (avoid Red/Purple/Blue), Black Coffee or Tea (Do not add                 anything to coffee or tea).  __X__2.  On the morning of surgery brush your teeth with toothpaste and water, you                may rinse your mouth with mouthwash if you wish.  Do not swallow any toothpaste or mouthwash.     _X__ 3.  No Alcohol for 24 hours before or after surgery.   _X__ 4.  Do Not Smoke or use e-cigarettes For 24 Hours Prior to Your Surgery.                 Do not use any chewable tobacco products for at least 6 hours prior to                 Surgery.  _X__  5.  Do not use any recreational drugs (marijuana, cocaine, heroin, ecstasy, MDMA or other)                For at least one week prior to your surgery.  Combination of these drugs with anesthesia                May have life threatening results.  ____  6.  Bring all medications with you on the day of surgery if instructed.   __X__  7.  Notify your doctor if there is any  change in your medical condition      (cold, fever, infections).     Do not wear jewelry, make-up, hairpins, clips or nail polish. Do not wear lotions, powders, or perfumes. You may wear deodorant. Do not shave 48 hours prior to surgery. Men may shave face and neck. Do not bring valuables to the hospital.    [facility] is not responsible for any belongings or valuables.  Contacts, dentures or bridgework may not be worn into surgery. Leave your suitcase in the car. After surgery it may be brought to your room. For patients admitted to the hospital,  discharge time is determined by your treatment team.   Patients discharged the day of surgery will not be allowed to drive home.   Make arrangements for someone to be with you for the first 24 hours of your Same Day Discharge.   __X__ Take these medicines the morning of surgery with A SIP OF WATER:    1. omeprazole (PRILOSEC) 20 MG   2.   3.  4.  5.  6.  ____ Fleet Enema (as directed)   __X__ Use CHG Soap (or wipes) as directed  ____ Use Benzoyl Peroxide Gel as instructed  __X__ Use inhalers on the day of surgery  albuterol (VENTOLIN HFA) 108 (90 Base) MCG/ACT inhaler   ____ Stop metformin 2 days prior to surgery    ____ Take 1/2 of usual insulin dose the night before surgery. No insulin the morning          of surgery.   ____ Call your PCP, cardiologist, or Pulmonologist if taking Coumadin/Plavix/aspirin and ask when to stop before your surgery.   __X__ One Week prior to surgery- Stop Anti-inflammatories such as Ibuprofen, Aleve, Advil, Motrin, meloxicam (MOBIC), diclofenac, etodolac, ketorolac, Toradol, Daypro, piroxicam, Goody's or BC powders. OK TO USE TYLENOL IF NEEDED   __X__ Stop these supplements until after surgery. MAGNESIUM, Omega-3 Fatty Acids (FISH OIL) and Zinc    ____ Bring C-Pap to the hospital.    If you have any questions regarding your pre-procedure instructions,  Please call Pre-admit Testing at  [phone]     Preparing for Surgery with CHLORHEXIDINE GLUCONATE (CHG) Soap  Chlorhexidine Gluconate (CHG) Soap  o An antiseptic cleaner that kills germs and bonds with the skin to continue killing germs even after washing  o Used for showering the night before surgery and morning of surgery  Before surgery, you can play an important role by reducing the number of germs on your skin.  CHG (Chlorhexidine gluconate) soap is an antiseptic cleanser which kills germs and bonds with the skin to continue killing germs even after washing.  Please do not use if you have an allergy to CHG or antibacterial soaps. If your skin becomes reddened/irritated stop using the CHG.  1. Shower the NIGHT BEFORE SURGERY and the MORNING OF SURGERY with CHG soap.  2. If you choose to wash your hair, wash your hair first as usual with your normal shampoo.  3. After shampooing, rinse your hair and body thoroughly to remove the shampoo.  4. Use CHG as you would any other liquid soap. You can apply CHG directly to the skin and wash gently with a scrungie or a clean washcloth.  5. Apply the CHG soap to your body only from the neck down. Do not use on open wounds or open sores. Avoid contact with your eyes, ears, mouth, and genitals (private parts). Wash face and genitals (private parts) with your normal soap.  6. Wash thoroughly, paying special attention to the area where your surgery will be performed.  7. Thoroughly rinse your body with warm water.  8. Do not shower/wash with your normal soap after using and rinsing off the CHG soap.  9. Pat yourself dry with a clean towel.  10. Wear clean pajamas to bed the night before surgery.  12. Place clean sheets on your bed the night of your first shower and do not sleep with pets.  13. Shower again with the CHG soap on the day of surgery prior to arriving at the hospital.  14. Do  not apply any deodorants/lotions/powders.  15. Please wear clean clothes to  the hospital.

## 2022-01-12 ENCOUNTER — Encounter
Admission: RE | Admit: 2022-01-12 | Discharge: 2022-01-12 | Disposition: A | Discharge status: Home / Self Care | Payer: 59 | Source: Ambulatory Visit | Attending: Surgery | Admitting: Surgery

## 2022-01-12 DIAGNOSIS — I10 Essential (primary) hypertension: Secondary | ICD-10-CM | POA: Diagnosis present

## 2022-01-12 DIAGNOSIS — Z01810 Encounter for preprocedural cardiovascular examination: Secondary | ICD-10-CM

## 2022-01-14 ENCOUNTER — Encounter
Admission: RE | Disposition: A | Discharge status: Home / Self Care | Payer: Self-pay | Source: Ambulatory Visit | Attending: Surgery

## 2022-01-14 ENCOUNTER — Ambulatory Visit
Admission: RE | Admit: 2022-01-14 | Discharge: 2022-01-14 | Disposition: A | Discharge status: Home / Self Care | Payer: 59 | Source: Ambulatory Visit | Attending: Surgery | Admitting: Surgery

## 2022-01-14 ENCOUNTER — Ambulatory Visit: Payer: 59 | Admitting: Certified Registered Nurse Anesthetist

## 2022-01-14 ENCOUNTER — Encounter: Payer: Self-pay | Admitting: Surgery

## 2022-01-14 ENCOUNTER — Other Ambulatory Visit: Payer: Self-pay

## 2022-01-14 DIAGNOSIS — R062 Wheezing: Secondary | ICD-10-CM | POA: Insufficient documentation

## 2022-01-14 DIAGNOSIS — F32A Depression, unspecified: Secondary | ICD-10-CM | POA: Insufficient documentation

## 2022-01-14 DIAGNOSIS — I471 Supraventricular tachycardia, unspecified: Secondary | ICD-10-CM | POA: Diagnosis not present

## 2022-01-14 DIAGNOSIS — K429 Umbilical hernia without obstruction or gangrene: Secondary | ICD-10-CM | POA: Insufficient documentation

## 2022-01-14 DIAGNOSIS — E785 Hyperlipidemia, unspecified: Secondary | ICD-10-CM | POA: Diagnosis not present

## 2022-01-14 DIAGNOSIS — U099 Post covid-19 condition, unspecified: Secondary | ICD-10-CM | POA: Diagnosis not present

## 2022-01-14 DIAGNOSIS — I10 Essential (primary) hypertension: Secondary | ICD-10-CM | POA: Diagnosis not present

## 2022-01-14 DIAGNOSIS — K4090 Unilateral inguinal hernia, without obstruction or gangrene, not specified as recurrent: Secondary | ICD-10-CM

## 2022-01-14 DIAGNOSIS — D176 Benign lipomatous neoplasm of spermatic cord: Secondary | ICD-10-CM | POA: Diagnosis not present

## 2022-01-14 DIAGNOSIS — N5082 Scrotal pain: Secondary | ICD-10-CM | POA: Insufficient documentation

## 2022-01-14 HISTORY — PX: INSERTION OF MESH: SHX5868

## 2022-01-14 HISTORY — PX: UMBILICAL HERNIA REPAIR: SHX196

## 2022-01-14 SURGERY — HERNIORRHAPHY, INGUINAL, ROBOT-ASSISTED, LAPAROSCOPIC
Anesthesia: General | Site: Abdomen | Laterality: Right

## 2022-01-14 MED ORDER — IBUPROFEN 800 MG PO TABS: 800.0000 mg | ORAL_TABLET | Freq: Three times a day (TID) | ORAL | 1 refills | Status: DC | PRN

## 2022-01-14 MED ORDER — OXYCODONE HCL 5 MG/5ML PO SOLN: 5.0000 mg | Freq: Once | ORAL | Status: DC | PRN

## 2022-01-14 MED ORDER — LIDOCAINE HCL (CARDIAC) PF 100 MG/5ML IV SOSY
PREFILLED_SYRINGE | INTRAVENOUS | Status: DC | PRN
  Administered 2022-01-14: 100 mg via INTRAVENOUS

## 2022-01-14 MED ORDER — ROCURONIUM BROMIDE 100 MG/10ML IV SOLN
INTRAVENOUS | Status: DC | PRN
  Administered 2022-01-14: 70 mg via INTRAVENOUS
  Administered 2022-01-14 (×2): 30 mg via INTRAVENOUS

## 2022-01-14 MED ORDER — GLYCOPYRROLATE 0.2 MG/ML IJ SOLN
INTRAMUSCULAR | Status: DC | PRN
  Administered 2022-01-14: .2 mg via INTRAVENOUS

## 2022-01-14 MED ORDER — SUGAMMADEX SODIUM 500 MG/5ML IV SOLN
INTRAVENOUS | Status: DC | PRN
  Administered 2022-01-14: 500 mg via INTRAVENOUS

## 2022-01-14 MED ORDER — LACTATED RINGERS IV SOLN: INTRAVENOUS | Status: DC

## 2022-01-14 MED ORDER — CHLORHEXIDINE GLUCONATE CLOTH 2 % EX PADS: 6.0000 | MEDICATED_PAD | Freq: Once | CUTANEOUS | Status: DC

## 2022-01-14 MED ORDER — HYDROMORPHONE HCL 1 MG/ML IJ SOLN
0.2500 mg | INTRAMUSCULAR | Status: DC | PRN
  Administered 2022-01-14: 0.25 mg via INTRAVENOUS

## 2022-01-14 MED ORDER — PHENYLEPHRINE 80 MCG/ML (10ML) SYRINGE FOR IV PUSH (FOR BLOOD PRESSURE SUPPORT)
PREFILLED_SYRINGE | INTRAVENOUS | Status: AC
  Filled 2022-01-14: qty 10

## 2022-01-14 MED ORDER — EPHEDRINE 5 MG/ML INJ
INTRAVENOUS | Status: AC
  Filled 2022-01-14: qty 10

## 2022-01-14 MED ORDER — MIDAZOLAM HCL 2 MG/2ML IJ SOLN
INTRAMUSCULAR | Status: DC | PRN
  Administered 2022-01-14: 2 mg via INTRAVENOUS

## 2022-01-14 MED ORDER — BUPIVACAINE-EPINEPHRINE (PF) 0.25% -1:200000 IJ SOLN
INTRAMUSCULAR | Status: AC
  Filled 2022-01-14: qty 30

## 2022-01-14 MED ORDER — PROPOFOL 10 MG/ML IV BOLUS
INTRAVENOUS | Status: AC
  Filled 2022-01-14: qty 20

## 2022-01-14 MED ORDER — DEXMEDETOMIDINE HCL IN NACL 80 MCG/20ML IV SOLN
INTRAVENOUS | Status: DC | PRN
  Administered 2022-01-14: 4 ug via INTRAVENOUS
  Administered 2022-01-14: 8 ug via INTRAVENOUS
  Administered 2022-01-14 (×2): 4 ug via INTRAVENOUS

## 2022-01-14 MED ORDER — FENTANYL CITRATE (PF) 100 MCG/2ML IJ SOLN
INTRAMUSCULAR | Status: DC | PRN
  Administered 2022-01-14 (×2): 50 ug via INTRAVENOUS
  Administered 2022-01-14: 100 ug via INTRAVENOUS

## 2022-01-14 MED ORDER — LIDOCAINE HCL (PF) 2 % IJ SOLN
INTRAMUSCULAR | Status: AC
  Filled 2022-01-14: qty 5

## 2022-01-14 MED ORDER — GABAPENTIN 300 MG PO CAPS
ORAL_CAPSULE | ORAL | Status: AC
  Administered 2022-01-14: 300 mg via ORAL
  Filled 2022-01-14: qty 1

## 2022-01-14 MED ORDER — DEXAMETHASONE SODIUM PHOSPHATE 10 MG/ML IJ SOLN
INTRAMUSCULAR | Status: DC | PRN
  Administered 2022-01-14: 10 mg via INTRAVENOUS

## 2022-01-14 MED ORDER — FENTANYL CITRATE (PF) 100 MCG/2ML IJ SOLN
INTRAMUSCULAR | Status: AC
  Filled 2022-01-14: qty 2

## 2022-01-14 MED ORDER — GLYCOPYRROLATE 0.2 MG/ML IJ SOLN
INTRAMUSCULAR | Status: AC
  Filled 2022-01-14: qty 1

## 2022-01-14 MED ORDER — ACETAMINOPHEN 500 MG PO TABS
ORAL_TABLET | ORAL | Status: AC
  Administered 2022-01-14: 1000 mg via ORAL
  Filled 2022-01-14: qty 2

## 2022-01-14 MED ORDER — OXYCODONE HCL 5 MG PO TABS: 5.0000 mg | ORAL_TABLET | Freq: Once | ORAL | Status: DC | PRN

## 2022-01-14 MED ORDER — ACETAMINOPHEN 500 MG PO TABS: 1000.0000 mg | ORAL_TABLET | Freq: Four times a day (QID) | ORAL | Status: DC | PRN

## 2022-01-14 MED ORDER — OXYCODONE HCL 5 MG PO TABS: 5.0000 mg | ORAL_TABLET | ORAL | 0 refills | Status: DC | PRN

## 2022-01-14 MED ORDER — PHENYLEPHRINE 80 MCG/ML (10ML) SYRINGE FOR IV PUSH (FOR BLOOD PRESSURE SUPPORT)
PREFILLED_SYRINGE | INTRAVENOUS | Status: DC | PRN
  Administered 2022-01-14 (×2): 80 ug via INTRAVENOUS
  Administered 2022-01-14: 160 ug via INTRAVENOUS

## 2022-01-14 MED ORDER — MIDAZOLAM HCL 2 MG/2ML IJ SOLN
INTRAMUSCULAR | Status: AC
  Filled 2022-01-14: qty 2

## 2022-01-14 MED ORDER — GABAPENTIN 300 MG PO CAPS: 300.0000 mg | ORAL_CAPSULE | ORAL | Status: AC

## 2022-01-14 MED ORDER — ONDANSETRON HCL 4 MG/2ML IJ SOLN
INTRAMUSCULAR | Status: AC
  Filled 2022-01-14: qty 2

## 2022-01-14 MED ORDER — HYDROMORPHONE HCL 1 MG/ML IJ SOLN
INTRAMUSCULAR | Status: AC
  Filled 2022-01-14: qty 1

## 2022-01-14 MED ORDER — ACETAMINOPHEN 500 MG PO TABS: 1000.0000 mg | ORAL_TABLET | ORAL | Status: AC

## 2022-01-14 MED ORDER — CHLORHEXIDINE GLUCONATE 0.12 % MT SOLN
OROMUCOSAL | Status: AC
  Administered 2022-01-14: 15 mL via OROMUCOSAL
  Filled 2022-01-14: qty 15

## 2022-01-14 MED ORDER — CEFAZOLIN IN SODIUM CHLORIDE 3-0.9 GM/100ML-% IV SOLN
3.0000 g | INTRAVENOUS | Status: AC
  Administered 2022-01-14: 3 g via INTRAVENOUS
  Filled 2022-01-14: qty 100

## 2022-01-14 MED ORDER — BUPIVACAINE LIPOSOME 1.3 % IJ SUSP: 20.0000 mL | Freq: Once | INTRAMUSCULAR | Status: DC

## 2022-01-14 MED ORDER — KETOROLAC TROMETHAMINE 30 MG/ML IJ SOLN
INTRAMUSCULAR | Status: DC | PRN
  Administered 2022-01-14: 30 mg via INTRAVENOUS

## 2022-01-14 MED ORDER — BUPIVACAINE LIPOSOME 1.3 % IJ SUSP
INTRAMUSCULAR | Status: AC
  Filled 2022-01-14: qty 20

## 2022-01-14 MED ORDER — BUPIVACAINE-EPINEPHRINE 0.25% -1:200000 IJ SOLN
INTRAMUSCULAR | Status: DC | PRN
  Administered 2022-01-14: 50 mL

## 2022-01-14 MED ORDER — ONDANSETRON HCL 4 MG/2ML IJ SOLN
INTRAMUSCULAR | Status: DC | PRN
  Administered 2022-01-14: 4 mg via INTRAVENOUS

## 2022-01-14 MED ORDER — EPHEDRINE SULFATE (PRESSORS) 50 MG/ML IJ SOLN
INTRAMUSCULAR | Status: DC | PRN
  Administered 2022-01-14 (×3): 5 mg via INTRAVENOUS

## 2022-01-14 MED ORDER — PROPOFOL 10 MG/ML IV BOLUS
INTRAVENOUS | Status: DC | PRN
  Administered 2022-01-14: 200 mg via INTRAVENOUS

## 2022-01-14 MED ORDER — ORAL CARE MOUTH RINSE: 15.0000 mL | Freq: Once | OROMUCOSAL | Status: AC

## 2022-01-14 MED ORDER — CHLORHEXIDINE GLUCONATE 0.12 % MT SOLN: 15.0000 mL | Freq: Once | OROMUCOSAL | Status: AC

## 2022-01-14 MED ORDER — ROCURONIUM BROMIDE 10 MG/ML (PF) SYRINGE
PREFILLED_SYRINGE | INTRAVENOUS | Status: AC
  Filled 2022-01-14: qty 20

## 2022-01-14 SURGICAL SUPPLY — 68 items
BLADE SURG 15 STRL LF DISP TIS (BLADE) ×2 IMPLANT
BLADE SURG 15 STRL SS (BLADE) ×2
CANNULA REDUC XI 12-8 STAPL (CANNULA) ×2
CANNULA REDUCER 12-8 DVNC XI (CANNULA) ×2 IMPLANT
CHLORAPREP W/TINT 26 (MISCELLANEOUS) ×2 IMPLANT
COVER TIP SHEARS 8 DVNC (MISCELLANEOUS) ×2 IMPLANT
COVER TIP SHEARS 8MM DA VINCI (MISCELLANEOUS) ×2
COVER WAND RF STERILE (DRAPES) ×2 IMPLANT
DERMABOND ADVANCED .7 DNX12 (GAUZE/BANDAGES/DRESSINGS) ×2 IMPLANT
DRAPE ARM DVNC X/XI (DISPOSABLE) ×6 IMPLANT
DRAPE COLUMN DVNC XI (DISPOSABLE) ×2 IMPLANT
DRAPE DA VINCI XI ARM (DISPOSABLE) ×6
DRAPE DA VINCI XI COLUMN (DISPOSABLE) ×2
DRAPE LAPAROTOMY 77X122 PED (DRAPES) ×2 IMPLANT
ELECT CAUTERY BLADE TIP 2.5 (TIP) ×2
ELECT REM PT RETURN 9FT ADLT (ELECTROSURGICAL) ×2
ELECTRODE CAUTERY BLDE TIP 2.5 (TIP) ×2 IMPLANT
ELECTRODE REM PT RTRN 9FT ADLT (ELECTROSURGICAL) ×2 IMPLANT
GAUZE 4X4 16PLY ~~LOC~~+RFID DBL (SPONGE) ×2 IMPLANT
GLOVE SURG SYN 7.0 (GLOVE) ×4 IMPLANT
GLOVE SURG SYN 7.0 PF PI (GLOVE) ×4 IMPLANT
GLOVE SURG SYN 7.5  E (GLOVE) ×4
GLOVE SURG SYN 7.5 E (GLOVE) ×4 IMPLANT
GLOVE SURG SYN 7.5 PF PI (GLOVE) ×4 IMPLANT
GOWN STRL REUS W/ TWL LRG LVL3 (GOWN DISPOSABLE) ×8 IMPLANT
GOWN STRL REUS W/TWL LRG LVL3 (GOWN DISPOSABLE) ×8
IRRIGATION STRYKERFLOW (MISCELLANEOUS) ×2 IMPLANT
IRRIGATOR STRYKERFLOW (MISCELLANEOUS)
IV NS 1000ML (IV SOLUTION)
IV NS 1000ML BAXH (IV SOLUTION) IMPLANT
KIT PINK PAD W/HEAD ARE REST (MISCELLANEOUS) ×2
KIT PINK PAD W/HEAD ARM REST (MISCELLANEOUS) ×2 IMPLANT
LABEL OR SOLS (LABEL) ×2 IMPLANT
MANIFOLD NEPTUNE II (INSTRUMENTS) ×2 IMPLANT
MESH 3DMAX MID 4X6 LT LRG (Mesh General) IMPLANT
MESH 3DMAX MID 4X6 RT LRG (Mesh General) IMPLANT
NDL INSUFFLATION 14GA 120MM (NEEDLE) ×2 IMPLANT
NEEDLE HYPO 22GX1.5 SAFETY (NEEDLE) ×2 IMPLANT
NEEDLE INSUFFLATION 14GA 120MM (NEEDLE) ×2 IMPLANT
NS IRRIG 500ML POUR BTL (IV SOLUTION) ×2 IMPLANT
OBTURATOR OPTICAL STANDARD 8MM (TROCAR) ×2
OBTURATOR OPTICAL STND 8 DVNC (TROCAR) ×2
OBTURATOR OPTICALSTD 8 DVNC (TROCAR) ×2 IMPLANT
PACK BASIN MINOR ARMC (MISCELLANEOUS) ×2 IMPLANT
PACK LAP CHOLECYSTECTOMY (MISCELLANEOUS) ×2 IMPLANT
PENCIL SMOKE EVACUATOR (MISCELLANEOUS) ×2 IMPLANT
SEAL CANN UNIV 5-8 DVNC XI (MISCELLANEOUS) ×6 IMPLANT
SEAL XI 5MM-8MM UNIVERSAL (MISCELLANEOUS) ×6
SET TUBE SMOKE EVAC HIGH FLOW (TUBING) ×2 IMPLANT
SOLUTION ELECTROLUBE (MISCELLANEOUS) ×2 IMPLANT
STAPLER CANNULA SEAL DVNC XI (STAPLE) ×2 IMPLANT
STAPLER CANNULA SEAL XI (STAPLE) ×2
SUT ETHIBOND 0 MO6 C/R (SUTURE) ×2 IMPLANT
SUT MNCRL AB 4-0 PS2 18 (SUTURE) ×2 IMPLANT
SUT V-LOC 90 ABS 3-0 VLT  V-20 (SUTURE) ×2
SUT V-LOC 90 ABS 3-0 VLT V-20 (SUTURE) IMPLANT
SUT VIC AB 2-0 SH 27 (SUTURE) ×2
SUT VIC AB 2-0 SH 27XBRD (SUTURE) ×4 IMPLANT
SUT VIC AB 3-0 SH 27 (SUTURE) ×2
SUT VIC AB 3-0 SH 27X BRD (SUTURE) ×2 IMPLANT
SUT VICRYL 0 AB UR-6 (SUTURE) IMPLANT
SUT VICRYL 0 UR6 27IN ABS (SUTURE) ×4 IMPLANT
SYR 20ML LL LF (SYRINGE) ×2 IMPLANT
SYR BULB IRRIG 60ML STRL (SYRINGE) ×2 IMPLANT
SYS BAG RETRIEVAL 10MM (BASKET) ×2
SYSTEM BAG RETRIEVAL 10MM (BASKET) IMPLANT
TAPE TRANSPORE STRL 2 31045 (GAUZE/BANDAGES/DRESSINGS) ×2 IMPLANT
TRAY FOLEY SLVR 16FR LF STAT (SET/KITS/TRAYS/PACK) ×2 IMPLANT

## 2022-01-14 NOTE — Op Note (Signed)
Procedure Date:  01/14/2022  Pre-operative Diagnosis:  Right inguinal hernia and umbilical hernia  Post-operative Diagnosis: Reducible Right inguinal hernia and reducible 2 cm umbilical hernia  Procedure: 1.  Robotic assisted Right Inguinal Hernia Repair 2.  Creation of Right Posterior Rectus-Transversalis Fascia Advancment Flap for Coverage of Pelvic Wound (200 cm) 3.  Open umbilical hernia repair  Surgeon:  Melvyn Neth, MD  Anesthesia:  General endotracheal  Estimated Blood Loss:  15 ml  Specimens:  None  Complications:  None  Indications for Procedure:  This is a 56 y.o. male who presents with a right inguinal hernia and a reducible umbilical hernia.  The options of surgery versus observation were reviewed with the patient and/or family. The risks of bleeding, abscess or infection, recurrence of symptoms, potential for an open procedure, injury to surrounding structures, and chronic pain were all discussed with the patient and he was willing to proceed.  We have planned this transabdominal procedure with the creation of a right peritoneal flap based on the posterior rectus sheath and transversalis fascia in order to fully cover the mesh, creating a natural tisssue barrier for the bowel and peritoneal cavity.  Description of Procedure: The patient was correctly identified in the preoperative area and brought into the operating room.  The patient was placed supine with VTE prophylaxis in place.  Appropriate time-outs were performed.  Anesthesia was induced and the patient was intubated.  Foley catheter was placed.  Appropriate antibiotics were infused.  The abdomen was prepped and draped in a sterile fashion. A supraumbilical incision was made. Cautery was used to dissect down the umbilical stalk and to separate it from the fascia.  This revealed a 2 cm umbilical hernia defect.  The preperitoneal fat and sac were resected and a 12 mm robotic port was inserted.  Pneumoperitoneum  was obtained with appropriate opening pressures.  A Veress needle was used to start dissecting the peritoneal flap.  Two 8-mm robotic ports were placed in the right and left lateral positions under direct visualization.  A large right Bard 3D Max Mesh, a 2-0 Vicryl, and 2-0 vlock suture were placed through the umbilical port under direct visualization.  The [employer] platform was docked onto the patient, the camera was inserted and targeted, and the instruments were placed under direct visualization.  Both inguinal regions were inspected for hernias and it was confirmed that the patient had a right inguinal hernia.  Using electocautery, the peritoneal and posterior rectus tissue flap was created.  The peritoneum on the right side was scored from the median umbilical ligament laterally towards the ASIS.  The flap was mobilized using robotic scissors and the bipolar instruments, creating a plane along the posterior rectus sheath and transversalis fascia down to the pubic tubercle medially. It was then further mobilized laterally across the inguinal canal and femoral vessels and onto the psoas muscle. The inferior epigastric vessels were identified and preserved. This created a posterior rectus and peritoneal flap measuring roughly 17 cm x 12 cm.  The patient had a small direct inguinal hernia and also a large cord lipoma through the inguinal canal.  The lipoma was resected.  The hernia sac was fully reduced preserving all structures.  A large right Bard 3D Max Mid mesh was placed with good overlap along all the potential hernia defects and secured in place with 2-0 Vicryl along the medial superomedial and superolateral aspects.  Then, the peritoneal flap was advanced over the mesh and carried over to close  the defect. A running 2-0 V lock suture was used to approximate the edge of the flap onto the peritoneum.  One small tear in the flap was closed using 2-0 Vicryl.  All needles were removed under direct  visualization.  The 8- mm ports were removed under direct visualization and the Hasson trocar was removed.  The hernia defect was closed using 0 vicryl sutures.  Local anesthetic was infused in all incisions as well as a right ilioinguinal block.  The umbilical stalk was reattached using 2-0 Vicryl, and the umbilical incision was closed using 3-0 Vicryl and 4-0 Monocryl.  The remaining port incisions were closed with 4-0 Monocryl.  The wounds were cleaned and sealed with DermaBond.  Foley catheter was removed and the patient was emerged from anesthesia and extubated and brought to the recovery room for further management.  The patient tolerated the procedure well and all counts were correct at the end of the case.   Melvyn Neth, MD

## 2022-01-14 NOTE — Anesthesia Postprocedure Evaluation (Signed)
Anesthesia Post Note  Patient: Anthony Hunter  Procedure(s) Performed: XI ROBOTIC ASSISTED INGUINAL HERNIA (Right: Abdomen) HERNIA REPAIR UMBILICAL ADULT, OPEN (Abdomen) INSERTION OF MESH (Abdomen)  Patient location during evaluation: PACU Anesthesia Type: General Level of consciousness: awake and alert Pain management: pain level controlled Vital Signs Assessment: post-procedure vital signs reviewed and stable Respiratory status: spontaneous breathing, nonlabored ventilation, respiratory function stable and patient connected to nasal cannula oxygen Cardiovascular status: blood pressure returned to baseline and stable Postop Assessment: no apparent nausea or vomiting Anesthetic complications: no   No notable events documented.   Last Vitals:  Vitals:   01/14/22 1330 01/14/22 1352  BP: 112/71 114/72  Pulse: 77 71  Resp: 15 16  Temp: 36.6 C 36.4 C  SpO2: 98% 96%    Last Pain:  Vitals:   01/14/22 1352  TempSrc: Temporal  PainSc: 4                  Precious Haws Galvin Aversa

## 2022-01-14 NOTE — Anesthesia Procedure Notes (Signed)
Procedure Name: Intubation Date/Time: 01/14/2022 10:23 AM  Performed by: Lily Peer, Vee Bahe, CRNAPre-anesthesia Checklist: Patient identified, Emergency Drugs available, Suction available and Patient being monitored Patient Re-evaluated:Patient Re-evaluated prior to induction Oxygen Delivery Method: Circle system utilized Preoxygenation: Pre-oxygenation with 100% oxygen Induction Type: IV induction Ventilation: Mask ventilation without difficulty Laryngoscope Size: McGraph and 4 Grade View: Grade I Tube type: Oral Tube size: 7.5 mm Number of attempts: 1 Airway Equipment and Method: Stylet Placement Confirmation: ETT inserted through vocal cords under direct vision, positive ETCO2 and breath sounds checked- equal and bilateral Secured at: 22 cm Tube secured with: Tape Dental Injury: Teeth and Oropharynx as per pre-operative assessment

## 2022-01-14 NOTE — Interval H&P Note (Signed)
History and Physical Interval Note:  01/14/2022 9:45 AM  Anthony Hunter  has presented today for surgery, with the diagnosis of K40.90 Right inguinal hernia O87.5 Umbilical hernia, less than 3 cm.  The various methods of treatment have been discussed with the patient and family. After consideration of risks, benefits and other options for treatment, the patient has consented to  Procedure(s): XI ROBOTIC ASSISTED INGUINAL HERNIA (Right) HERNIA REPAIR UMBILICAL ADULT, OPEN (N/A) INSERTION OF MESH (N/A) as a surgical intervention.  The patient's history has been reviewed, patient examined, no change in status, stable for surgery.  I have reviewed the patient's chart and labs.  Questions were answered to the patient's satisfaction.     Donatello Kleve

## 2022-01-14 NOTE — Transfer of Care (Addendum)
Immediate Anesthesia Transfer of Care Note  Patient: Anthony Hunter  Procedure(s) Performed: XI ROBOTIC ASSISTED INGUINAL HERNIA (Right: Abdomen) HERNIA REPAIR UMBILICAL ADULT, OPEN (Abdomen) INSERTION OF MESH (Abdomen)  Patient Location: PACU  Anesthesia Type:General  Level of Consciousness: awake and patient cooperative  Airway & Oxygen Therapy: Patient Spontanous Breathing and Patient connected to face mask oxygen  Post-op Assessment: Report given to RN and Post -op Vital signs reviewed and stable  Post vital signs: Reviewed and stable  Last Vitals:  Vitals Value Taken Time  BP 121/61 01/14/22 1248  Temp    Pulse 72 01/14/22 1250  Resp 13 01/14/22 1250  SpO2 98 % 01/14/22 1250  Vitals shown include unvalidated device data.  Last Pain:  Vitals:   01/14/22 0914  TempSrc: Temporal  PainSc: 0-No pain         Complications: No notable events documented.

## 2022-01-14 NOTE — Discharge Instructions (Signed)
AMBULATORY SURGERY  ?DISCHARGE INSTRUCTIONS ? ? ?The drugs that you were given will stay in your system until tomorrow so for the next 24 hours you should not: ? ?Drive an automobile ?Make any legal decisions ?Drink any alcoholic beverage ? ? ?You may resume regular meals tomorrow.  Today it is better to start with liquids and gradually work up to solid foods. ? ?You may eat anything you prefer, but it is better to start with liquids, then soup and crackers, and gradually work up to solid foods. ? ? ?Please notify your doctor immediately if you have any unusual bleeding, trouble breathing, redness and pain at the surgery site, drainage, fever, or pain not relieved by medication. ? ? ? ?Additional Instructions: ? ? ? ?Please contact your physician with any problems or Same Day Surgery at [phone], Monday through Friday 6 am to 4 pm, or [place] [place] Main number at [phone].  ?
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
## 2022-01-18 ENCOUNTER — Other Ambulatory Visit: Payer: Self-pay | Admitting: Family Medicine

## 2022-01-18 NOTE — Telephone Encounter (Signed)
Requested Prescriptions  Pending Prescriptions Disp Refills   omeprazole (PRILOSEC) 20 MG capsule [Pharmacy Med Name: OMEPRAZOLE DR 20 MG CAPSULE] 90 capsule 0    Sig: TAKE 1 CAPSULE BY MOUTH EVERY DAY     Gastroenterology: Proton Pump Inhibitors Passed - 01/18/2022  1:51 AM      Passed - Valid encounter within last 12 months    Recent Outpatient Visits           3 weeks ago Primary hypertension   [place], [name], DO   7 months ago Routine general medical examination at a health care facility   [facility], [place], [place]   1 year ago Primary hypertension   [place], [name], DO   1 year ago Osteoarthritis of lumbar spine, unspecified spinal osteoarthritis complication status   [place], DO   2 years ago Chronic left-sided low back pain without sciatica   [facility] Valerie Roys, DO       Future Appointments             In 5 months Wynetta Emery, Barb Merino, DO [name]

## 2022-01-19 ENCOUNTER — Encounter: Payer: Self-pay | Admitting: Physician Assistant

## 2022-01-19 ENCOUNTER — Ambulatory Visit (INDEPENDENT_AMBULATORY_CARE_PROVIDER_SITE_OTHER): Payer: 59 | Admitting: Physician Assistant

## 2022-01-19 ENCOUNTER — Other Ambulatory Visit: Payer: Self-pay | Admitting: Family Medicine

## 2022-01-19 VITALS — BP 120/87 | HR 80 | Temp 98.4°F | Ht 73.0 in | Wt 266.0 lb

## 2022-01-19 DIAGNOSIS — K429 Umbilical hernia without obstruction or gangrene: Secondary | ICD-10-CM

## 2022-01-19 DIAGNOSIS — Z09 Encounter for follow-up examination after completed treatment for conditions other than malignant neoplasm: Secondary | ICD-10-CM

## 2022-01-19 DIAGNOSIS — K4090 Unilateral inguinal hernia, without obstruction or gangrene, not specified as recurrent: Secondary | ICD-10-CM

## 2022-01-19 NOTE — Telephone Encounter (Signed)
Requested Prescriptions  Pending Prescriptions Disp Refills   albuterol (VENTOLIN HFA) 108 (90 Base) MCG/ACT inhaler [Pharmacy Med Name: ALBUTEROL HFA (PROAIR) INHALER] 8.5 each 2    Sig: TAKE 2 PUFFS BY MOUTH EVERY 6 HOURS AS NEEDED FOR WHEEZE OR SHORTNESS OF BREATH     Pulmonology:  Beta Agonists 2 Passed - 01/19/2022  1:11 AM      Passed - Last BP in normal range    BP Readings from Last 1 Encounters:  01/14/22 114/72         Passed - Last Heart Rate in normal range    Pulse Readings from Last 1 Encounters:  01/14/22 71         Passed - Valid encounter within last 12 months    Recent Outpatient Visits           4 weeks ago Primary hypertension   [facility] [place], [name], DO   7 months ago Routine general medical examination at a health care facility   [place], [place], DO   1 year ago Primary hypertension   [facility] [place], [name], DO   1 year ago Osteoarthritis of lumbar spine, unspecified spinal osteoarthritis complication status   [place], DO   2 years ago Chronic left-sided low back pain without sciatica   [facility] [place] Valerie Roys, DO       Future Appointments             In 5 months Wynetta Emery, Barb Merino, DO [name]

## 2022-01-19 NOTE — Patient Instructions (Addendum)
You may cancel you next appointment if you are feeling great.   GENERAL POST-OPERATIVE PATIENT INSTRUCTIONS   WOUND CARE INSTRUCTIONS: Try to keep the wound dry and avoid ointments on the wound unless directed to do so.  If the wound becomes bright red and painful or starts to drain infected material that is not clear, please contact your physician immediately.  If the wound is mildly pink and has a thick firm ridge underneath it, this is normal, and is referred to as a healing ridge.  This will resolve over the next 4-6 weeks.  BATHING: You may shower if you have been informed of this by your surgeon. However, Please do not submerge in a tub, hot tub, or pool until incisions are completely sealed or have been told by your surgeon that you may do so.  DIET:  You may eat any foods that you can tolerate.  It is a good idea to eat a high fiber diet and take in plenty of fluids to prevent constipation.  If you do become constipated you may want to take a mild laxative or take ducolax tablets on a daily basis until your bowel habits are regular.  Constipation can be very uncomfortable, along with straining, after recent surgery.  ACTIVITY: You are encouraged to walk and engage in light activity for the next two weeks.  You should not lift more than 20 pounds for 6 weeks total after surgery as it could put you at increased risk for complications.  Twenty pounds is roughly equivalent to a plastic bag of groceries. At that time- Listen to your body when lifting, if you have pain when lifting, stop and then try again in a few days. Soreness after doing exercises or activities of daily living is normal as you get back in to your normal routine.  MEDICATIONS: Try to take narcotic medications and anti-inflammatory medications, such as tylenol, ibuprofen, naprosyn, etc., with food.  This will minimize stomach upset from the medication.  Should you develop nausea and vomiting from the pain medication, or develop a  rash, please discontinue the medication and contact your physician.  You should not drive, make important decisions, or operate machinery when taking narcotic pain medication.  SUNBLOCK Use sun block to incision area over the next year if this area will be exposed to sun. This helps decrease scarring and will allow you avoid a permanent darkened area over your incision.  QUESTIONS: Please feel free to call our office if you have any questions, and we will be glad to assist you. [phone]

## 2022-01-19 NOTE — Progress Notes (Signed)
[facility] POST-OP OFFICE VISIT  01/19/2022  HPI: Anthony Hunter is a 56 y.o. male 5 days s/p robotic assisted laparoscopic right inguinal hernia repair and open umbilical hernia repair with Dr Kirke Corin   He presents early given concerns over changes to his skin at the umbilical wound Noticed immediately after surgery this was a purple color but this turned to a light red/orange color No drainage from the incisions No fever, chills He is extremely happy with the procedure; reports almost no pain No nausea, emesis No other complaints   Vital signs: BP 120/87   Pulse 80   Temp 98.4 F (36.9 C)   Ht '6\' 1"'$  (1.854 m)   Wt 266 lb (120.7 kg)   SpO2 98%   BMI 35.09 kg/m    Physical Exam: Constitutional: Well appearing male, NAD Abdomen: Soft, non-tender, non-distended, no rebound/guarding Skin: Laparoscopic incisions are healing well. He has ecchymosis in various stages of healing inferior to the umbilical incision consistent with small hematoma, left port sight is also ecchymotic. There is no erythema nor drainage. No evidence of infection.   Assessment/Plan: [name] 5 days s/p robotic assisted laparoscopic right inguinal hernia repair and open umbilical hernia repair with Dr Kirke Corin    - No evidence of infection; No indication for Abx nor I&D  - Pain control prn  - Reviewed wound care recommendation  - Reviewed lifting restrictions; 6 weeks total  - He is set to follow up on 12/14 if desired or having issues; He understands to call with questions/concerns in the interim   -- Edison Simon, [name] [facility] 01/19/2022, 2:43 PM M-F: 7am - 4pm

## 2022-01-28 ENCOUNTER — Encounter: Payer: 59 | Admitting: Physician Assistant

## 2022-01-29 ENCOUNTER — Encounter: Payer: Self-pay | Admitting: Physician Assistant

## 2022-01-29 ENCOUNTER — Ambulatory Visit (INDEPENDENT_AMBULATORY_CARE_PROVIDER_SITE_OTHER): Payer: 59 | Admitting: Physician Assistant

## 2022-01-29 VITALS — BP 137/94 | HR 71 | Temp 98.3°F | Ht 73.0 in | Wt 265.2 lb

## 2022-01-29 DIAGNOSIS — K429 Umbilical hernia without obstruction or gangrene: Secondary | ICD-10-CM

## 2022-01-29 DIAGNOSIS — Z09 Encounter for follow-up examination after completed treatment for conditions other than malignant neoplasm: Secondary | ICD-10-CM

## 2022-01-29 DIAGNOSIS — K4090 Unilateral inguinal hernia, without obstruction or gangrene, not specified as recurrent: Secondary | ICD-10-CM

## 2022-01-29 NOTE — Patient Instructions (Signed)
If you have any concerns or questions, please feel free to call our office.    GENERAL POST-OPERATIVE PATIENT INSTRUCTIONS   WOUND CARE INSTRUCTIONS:  Keep a dry clean dressing on the wound if there is drainage. The initial bandage may be removed after 24 hours.  Once the wound has quit draining you may leave it open to air.  If clothing rubs against the wound or causes irritation and the wound is not draining you may cover it with a dry dressing during the daytime.  Try to keep the wound dry and avoid ointments on the wound unless directed to do so.  If the wound becomes bright red and painful or starts to drain infected material that is not clear, please contact your physician immediately.  If the wound is mildly pink and has a thick firm ridge underneath it, this is normal, and is referred to as a healing ridge.  This will resolve over the next 4-6 weeks.  BATHING: You may shower if you have been informed of this by your surgeon. However, Please do not submerge in a tub, hot tub, or pool until incisions are completely sealed or have been told by your surgeon that you may do so.  DIET:  You may eat any foods that you can tolerate.  It is a good idea to eat a high fiber diet and take in plenty of fluids to prevent constipation.  If you do become constipated you may want to take a mild laxative or take ducolax tablets on a daily basis until your bowel habits are regular.  Constipation can be very uncomfortable, along with straining, after recent surgery.  ACTIVITY:  You are encouraged to cough and deep breath or use your incentive spirometer if you were given one, every 15-30 minutes when awake.  This will help prevent respiratory complications and low grade fevers post-operatively if you had a general anesthetic.  You may want to hug a pillow when coughing and sneezing to add additional support to the surgical area, if you had abdominal or chest surgery, which will decrease pain during these times.   You are encouraged to walk and engage in light activity for the next two weeks.  You should not lift more than 20 pounds for 6 weeks total after surgery as it could put you at increased risk for complications.  Twenty pounds is roughly equivalent to a plastic bag of groceries. At that time- Listen to your body when lifting, if you have pain when lifting, stop and then try again in a few days. Soreness after doing exercises or activities of daily living is normal as you get back in to your normal routine.  MEDICATIONS:  Try to take narcotic medications and anti-inflammatory medications, such as tylenol, ibuprofen, naprosyn, etc., with food.  This will minimize stomach upset from the medication.  Should you develop nausea and vomiting from the pain medication, or develop a rash, please discontinue the medication and contact your physician.  You should not drive, make important decisions, or operate machinery when taking narcotic pain medication.  SUNBLOCK Use sun block to incision area over the next year if this area will be exposed to sun. This helps decrease scarring and will allow you avoid a permanent darkened area over your incision.  QUESTIONS:  Please feel free to call our office if you have any questions, and we will be glad to assist you. [phone]

## 2022-01-29 NOTE — Progress Notes (Signed)
[facility] POST-OP OFFICE VISIT  01/29/2022  HPI: Anthony Hunter is a 57 y.o. male 15 days s/p robotic assisted laparoscopic right inguinal hernia repair and open umbilical hernia repair with Dr Kirke Corin    He continues to do remarkably well No abdominal pain No fever, chills, nausea, emesis He is tolerating PO; no issues with bowel function Incisions are well healed   Vital signs: BP (!) 137/94   Pulse 71   Temp 98.3 F (36.8 C) (Oral)   Ht '6\' 1"'$  (1.854 m)   Wt 265 lb 3.2 oz (120.3 kg)   SpO2 97%   BMI 34.99 kg/m    Physical Exam: Constitutional: Well appearing male, NAD Abdomen: Soft, non-tender, non-distended, no rebound/guarding Skin: Laparoscopic incisions are healing well, no erythema or drainage   Assessment/Plan: This is a 56 y.o. male 15 days s/p robotic assisted laparoscopic right inguinal hernia repair and open umbilical hernia repair with Dr Kirke Corin     - Pain control prn  - Reviewed wound care recommendation  - Reviewed lifting restrictions; 4-6 weeks total  - He can follow up on as needed basis; He understands to call with questions/concerns  -- Edison Simon, [name] [facility] 01/29/2022, 10:51 AM M-F: 7am - 4pm

## 2022-02-09 ENCOUNTER — Ambulatory Visit: Payer: Self-pay

## 2022-02-09 ENCOUNTER — Telehealth: Payer: 59 | Admitting: Physician Assistant

## 2022-02-09 ENCOUNTER — Encounter: Payer: Self-pay | Admitting: Physician Assistant

## 2022-02-09 DIAGNOSIS — K122 Cellulitis and abscess of mouth: Secondary | ICD-10-CM

## 2022-02-09 DIAGNOSIS — J029 Acute pharyngitis, unspecified: Secondary | ICD-10-CM | POA: Diagnosis not present

## 2022-02-09 MED ORDER — PREDNISONE 20 MG PO TABS: 40.0000 mg | ORAL_TABLET | Freq: Every day | ORAL | 0 refills | Status: DC

## 2022-02-09 MED ORDER — AMOXICILLIN 500 MG PO TABS: 500.0000 mg | ORAL_TABLET | Freq: Two times a day (BID) | ORAL | 0 refills | Status: AC

## 2022-02-09 NOTE — Progress Notes (Signed)
Virtual Visit Consent   Anthony Hunter, you are scheduled for a virtual visit with a [facility] provider today. Just as with appointments in the office, your consent must be obtained to participate. Your consent will be active for this visit and any virtual visit you may have with one of our providers in the next 365 days. If you have a MyChart account, a copy of this consent can be sent to you electronically.  As this is a virtual visit, video technology does not allow for your provider to perform a traditional examination. This may limit your provider's ability to fully assess your condition. If your provider identifies any concerns that need to be evaluated in person or the need to arrange testing (such as labs, EKG, etc.), we will make arrangements to do so. Although advances in technology are sophisticated, we cannot ensure that it will always work on either your end or our end. If the connection with a video visit is poor, the visit may have to be switched to a telephone visit. With either a video or telephone visit, we are not always able to ensure that we have a secure connection.  By engaging in this virtual visit, you consent to the provision of healthcare and authorize for your insurance to be billed (if applicable) for the services provided during this visit. Depending on your insurance coverage, you may receive a charge related to this service.  I need to obtain your verbal consent now. Are you willing to proceed with your visit today? Anthony Hunter has provided verbal consent on 02/09/2022 for a virtual visit (video or telephone). Anthony Hunter, [place]  Date: 02/09/2022 1:43 PM  Virtual Visit via Video Note   I, Anthony Hunter, connected with  Anthony Hunter  ([id number], 1965/06/10) on 02/09/22 at  1:30 PM EST by a video-enabled telemedicine application and verified that I am speaking with the correct person using two identifiers.  Location: Patient: Virtual Visit Location Patient:  Home Provider: Virtual Visit Location Provider: Home Office   I discussed the limitations of evaluation and management by telemedicine and the availability of in person appointments. The patient expressed understanding and agreed to proceed.    History of Present Illness: Anthony Hunter is a 56 y.o. who identifies as a male who was assigned male at birth, and is being seen today for severe sore throat over the past 3 days. Is associated with nasal congestion, rhinorrhea and wheezing. Is using his albuterol nebulizer BID which gives some relief. Denies fever, chills, aches. Daughter with viral URI last week. Not diagnosed with COVID, RSV, Flu. Patient with 2 negative home COVID tests.   HPI: HPI  Problems:  Patient Active Problem List   Diagnosis Date Noted   Right inguinal hernia 37/16/9678   Umbilical hernia without obstruction and without gangrene 01/14/2022   Osteoarthritis of lumbar spine 03/03/2020   Varicose veins of left lower extremity with pain 06/27/2018   Chronic venous insufficiency 06/27/2018   Dizziness 11/03/2017   Post concussion syndrome 11/03/2017   History of hypothyroidism 04/22/2017   Depression 09/26/2014   Hyperlipidemia    Hypertension    Right shoulder pain 04/13/2012    Allergies:  Allergies  Allergen Reactions   Shrimp (Diagnostic) Anaphylaxis   Shrimp [Shellfish Allergy]     Patient states he's only allergic to shrimp, but can eat crab, lobster, etc. No issues with iodine solutions   Medications:  Current Outpatient Medications:    predniSONE (DELTASONE) 20 MG tablet, Take  2 tablets (40 mg total) by mouth daily with breakfast., Disp: 10 tablet, Rfl: 0   albuterol (VENTOLIN HFA) 108 (90 Base) MCG/ACT inhaler, TAKE 2 PUFFS BY MOUTH EVERY 6 HOURS AS NEEDED FOR WHEEZE OR SHORTNESS OF BREATH, Disp: 8.5 each, Rfl: 2   amLODipine (NORVASC) 10 MG tablet, Take 1 tablet (10 mg total) by mouth daily., Disp: 90 tablet, Rfl: 1   benazepril (LOTENSIN) 40 MG tablet, Take  1 tablet (40 mg total) by mouth daily., Disp: 90 tablet, Rfl: 1   EPINEPHrine (EPIPEN 2-PAK) 0.3 mg/0.3 mL IJ SOAJ injection, Inject 0.3 mg into the muscle as needed for anaphylaxis., Disp: 2 each, Rfl: 1   MAGNESIUM PO, Take 400 mg by mouth daily., Disp: , Rfl:    Multiple Vitamins-Minerals (MULTIVITAMIN GUMMIES ADULT PO), Take 2 each by mouth daily., Disp: , Rfl:    Multiple Vitamins-Minerals (ZINC PO), Take 1 tablet by mouth daily., Disp: , Rfl:    Omega-3 Fatty Acids (FISH OIL) 1000 MG CAPS, Take 1 capsule by mouth., Disp: , Rfl:    omeprazole (PRILOSEC) 20 MG capsule, TAKE 1 CAPSULE BY MOUTH EVERY DAY, Disp: 90 capsule, Rfl: 0   PARoxetine (PAXIL) 20 MG tablet, Take 1 tablet (20 mg total) by mouth daily., Disp: 90 tablet, Rfl: 1   VITAMIN D PO, Take 10,000 Units by mouth daily., Disp: , Rfl:   Observations/Objective: Patient is well-developed, well-nourished in no acute distress.  Resting comfortably at home.  Head is normocephalic, atraumatic.  No labored breathing. Speech is clear and coherent with logical content.  Patient is alert and oriented at baseline.   Assessment and Plan: 1. Uvulitis - predniSONE (DELTASONE) 20 MG tablet; Take 2 tablets (40 mg total) by mouth daily with breakfast.  Dispense: 10 tablet; Refill: 0  2. Viral pharyngitis - predniSONE (DELTASONE) 20 MG tablet; Take 2 tablets (40 mg total) by mouth daily with breakfast.  Dispense: 10 tablet; Refill: 0  Suspect viral pharyngitis and subsequent uvulitis. Supportive measures and OTC medications reviewed. Vitamin recommendations given. Prednisone per orders. Daughter has repeat evaluation with pediatrician today for repeat testing. He is to message me if she turns positive for anything in particular on repeat testing.  Follow Up Instructions: I discussed the assessment and treatment plan with the patient. The patient was provided an opportunity to ask questions and all were answered. The patient agreed with the plan  and demonstrated an understanding of the instructions.  A copy of instructions were sent to the patient via MyChart unless otherwise noted below.   The patient was advised to call back or seek an in-person evaluation if the symptoms worsen or if the condition fails to improve as anticipated.  Time:  I spent 10 minutes with the patient via telehealth technology discussing the above problems/concerns.    Anthony Rio, [name]

## 2022-02-09 NOTE — Patient Instructions (Signed)
Anthony Hunter, thank you for joining [name], [name] for today's virtual visit.  While this provider is not your primary care provider (PCP), if your PCP is located in our provider database this encounter information will be shared with them immediately following your visit.   [facility] [place] [facility] account gives you access to today's visit and all your visits, tests, and labs performed at [facility] " click here if you don't have a [facility] account or go to [email or web address]  Consent: (Patient) Anthony Hunter provided verbal consent for this virtual visit at the beginning of the encounter.  Current Medications:  Current Outpatient Medications:    albuterol (VENTOLIN HFA) 108 (90 Base) MCG/ACT inhaler, TAKE 2 PUFFS BY MOUTH EVERY 6 HOURS AS NEEDED FOR WHEEZE OR SHORTNESS OF BREATH, Disp: 8.5 each, Rfl: 2   amLODipine (NORVASC) 10 MG tablet, Take 1 tablet (10 mg total) by mouth daily., Disp: 90 tablet, Rfl: 1   benazepril (LOTENSIN) 40 MG tablet, Take 1 tablet (40 mg total) by mouth daily., Disp: 90 tablet, Rfl: 1   EPINEPHrine (EPIPEN 2-PAK) 0.3 mg/0.3 mL IJ SOAJ injection, Inject 0.3 mg into the muscle as needed for anaphylaxis., Disp: 2 each, Rfl: 1   MAGNESIUM PO, Take 400 mg by mouth daily., Disp: , Rfl:    Multiple Vitamins-Minerals (MULTIVITAMIN GUMMIES ADULT PO), Take 2 each by mouth daily., Disp: , Rfl:    Multiple Vitamins-Minerals (ZINC PO), Take 1 tablet by mouth daily., Disp: , Rfl:    Omega-3 Fatty Acids (FISH OIL) 1000 MG CAPS, Take 1 capsule by mouth., Disp: , Rfl:    omeprazole (PRILOSEC) 20 MG capsule, TAKE 1 CAPSULE BY MOUTH EVERY DAY, Disp: 90 capsule, Rfl: 0   PARoxetine (PAXIL) 20 MG tablet, Take 1 tablet (20 mg total) by mouth daily., Disp: 90 tablet, Rfl: 1   VITAMIN D PO, Take 10,000 Units by mouth daily., Disp: , Rfl:    Medications ordered in this encounter:  No orders of the defined types were placed in this encounter.    *If you  need refills on other medications prior to your next appointment, please contact your pharmacy*  Follow-Up: Call back or seek an in-person evaluation if the symptoms worsen or if the condition fails to improve as anticipated.  [facility] [phone]  Other Instructions Please keep hydrated and rest. Continue Tylenol OTC. Start a saline nasal rinse and an OTC antihistamine like Claritin or Xyzal. Take the steroid as directed. Run a humidifier in the bedroom at night. Ok to use Mucinex to thin congestion. Keep up with asthma medications.  Start OTC Vitamin D3 1000 units and Vitamin C 1000 mg daily for immune support.  Message me back after your daughter repeat evaluation today in the case that she tests positive for strep, RSV, mono, etc on repeat testing.    If you have been instructed to have an in-person evaluation today at a local Urgent Care facility, please use the link below. It will take you to a list of all of our available [facility], including address, phone number and hours of operation. Please do not delay care.  [facility]  If you or a family member do not have a primary care provider, use the link below to schedule a visit and establish care. When you choose a  primary care physician or advanced practice provider, you gain a long-term partner in health. Find a Primary Care Provider  Learn more about [facility] in-office and virtual care options: [facility]

## 2022-02-09 NOTE — Telephone Encounter (Signed)
     Chief Complaint: Severe sore throat,swelling,runny nose Symptoms: Above Frequency: 3 days ago Pertinent Negatives: Patient denies  Disposition: '[]'$ ED /'[x]'$ Urgent Care (no appt availability in office) / '[]'$ Appointment(In office/virtual)/ '[]'$  [facility]/ '[]'$ Home Care/ '[]'$ Refused Recommended Disposition /'[]'$ [facility]/ '[]'$  Follow-up with PCP Additional Notes:   Reason for Disposition  SEVERE (e.g., excruciating) throat pain  Answer Assessment - Initial Assessment Questions 1. ONSET: "When did the throat start hurting?" (Hours or days ago)      3 days ago 2. SEVERITY: "How bad is the sore throat?" (Scale 1-10; mild, moderate or severe)   - MILD (1-3):  Doesn't interfere with eating or normal activities.   - MODERATE (4-7): Interferes with eating some solids and normal activities.   - SEVERE (8-10):  Excruciating pain, interferes with most normal activities.   - SEVERE WITH DYSPHAGIA (10): Can't swallow liquids, drooling.     Moderate 3. STREP EXPOSURE: "Has there been any exposure to strep within the past week?" If Yes, ask: "What type of contact occurred?"      No 4.  VIRAL SYMPTOMS: "Are there any symptoms of a cold, such as a runny nose, cough, hoarse voice or red eyes?"      Runny nose 5. FEVER: "Do you have a fever?" If Yes, ask: "What is your temperature, how was it measured, and when did it start?"     No 6. PUS ON THE TONSILS: "Is there pus on the tonsils in the back of your throat?"     No 7. OTHER SYMPTOMS: "Do you have any other symptoms?" (e.g., difficulty breathing, headache, rash)     Wheezing 8. PREGNANCY: "Is there any chance you are pregnant?" "When was your last menstrual period?"     N/a  Protocols used: Sore Throat-A-AH

## 2022-04-07 ENCOUNTER — Ambulatory Visit
Admission: EM | Admit: 2022-04-07 | Discharge: 2022-04-07 | Disposition: A | Discharge status: Home / Self Care | Payer: 59 | Attending: Urgent Care | Admitting: Urgent Care

## 2022-04-07 ENCOUNTER — Encounter: Payer: Self-pay | Admitting: Emergency Medicine

## 2022-04-07 DIAGNOSIS — J019 Acute sinusitis, unspecified: Secondary | ICD-10-CM | POA: Diagnosis not present

## 2022-04-07 DIAGNOSIS — B9689 Other specified bacterial agents as the cause of diseases classified elsewhere: Secondary | ICD-10-CM

## 2022-04-07 DIAGNOSIS — R051 Acute cough: Secondary | ICD-10-CM

## 2022-04-07 MED ORDER — BENZONATATE 100 MG PO CAPS: ORAL_CAPSULE | ORAL | 0 refills | Status: DC

## 2022-04-07 MED ORDER — DOXYCYCLINE HYCLATE 100 MG PO CAPS: 100.0000 mg | ORAL_CAPSULE | Freq: Two times a day (BID) | ORAL | 0 refills | Status: AC

## 2022-04-07 MED ORDER — PREDNISONE 20 MG PO TABS: ORAL_TABLET | ORAL | 0 refills | Status: AC

## 2022-04-07 MED ORDER — HYDROCOD POLI-CHLORPHE POLI ER 10-8 MG/5ML PO SUER: 5.0000 mL | Freq: Two times a day (BID) | ORAL | 0 refills | Status: AC | PRN

## 2022-04-07 NOTE — ED Triage Notes (Addendum)
Reports symptoms on-going x 3 months. Sore throat, cough, wheezing, nasal congestion. States symptoms have been waxing and waning since the start. Has been on steroids and broad-spectrum anti-biotic with minor improvement, but no resolution. Continues to take albuterol and mucinex at home. Travels frequently for work. Symptoms flared again starting 3 days ago, c/o raw throat with some hoarseness. Denies smoking or tobacco use hx, does have PMH of GERD which he treats with omeprazole daily.

## 2022-04-07 NOTE — Discharge Instructions (Addendum)
Follow up here or with your primary care provider if your symptoms are worsening or not improving with treatment.     
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
## 2022-04-15 ENCOUNTER — Other Ambulatory Visit: Payer: Self-pay | Admitting: Family Medicine

## 2022-04-15 NOTE — Telephone Encounter (Signed)
Requested Prescriptions  Pending Prescriptions Disp Refills   omeprazole (PRILOSEC) 20 MG capsule [Pharmacy Med Name: OMEPRAZOLE DR 20 MG CAPSULE] 90 capsule 2    Sig: TAKE 1 CAPSULE BY MOUTH EVERY DAY     Gastroenterology: Proton Pump Inhibitors Passed - 04/15/2022  1:56 AM      Passed - Valid encounter within last 12 months    Recent Outpatient Visits           3 months ago Primary hypertension   [place], [place], DO   10 months ago Routine general medical examination at a health care facility   [place], [place], DO   1 year ago Primary hypertension   [place], [name], DO   2 years ago Osteoarthritis of lumbar spine, unspecified spinal osteoarthritis complication status   [place], DO   2 years ago Chronic left-sided low back pain without sciatica   [place], Melvina, DO       Future Appointments             In 2 months Wynetta Emery, Barb Merino, DO [name]

## 2022-04-19 ENCOUNTER — Ambulatory Visit: Payer: Self-pay

## 2022-04-19 NOTE — Telephone Encounter (Signed)
  Chief Complaint: Wheezing , Post nasal drip snoring Symptoms: above Frequency: Since Oct off and on Pertinent Negatives: Patient denies SOB Disposition: '[]'$ ED /'[]'$ Urgent Care (no appt availability in office) / '[x]'$ Appointment(In office/virtual)/ '[]'$  [place] [facility]/ '[]'$ Home Care/ '[]'$ Refused Recommended Disposition /'[]'$  Mobile Bus/ '[]'$  Follow-up with PCP Additional Notes: Pt had COVID in October and since then has had some ongoing breathing issues.  PT has been to UC 2x for breathing issues. Pt has had some wheezing and SOB at times. Pt also has post nasal drip. Wife reports snoring.  PT would like to find the cause of these issues.   Reason for Disposition  [1] MILD longstanding difficulty breathing AND [2]  SAME as normal  Answer Assessment - Initial Assessment Questions 1. RESPIRATORY STATUS: "Describe your breathing?" (e.g., wheezing, shortness of breath, unable to speak, severe coughing)      Wheezing 2. ONSET: "When did this breathing problem begin?"      October 3. PATTERN "Does the difficult breathing come and go, or has it been constant since it started?"      Comes and goes 4. SEVERITY: "How bad is your breathing?" (e.g., mild, moderate, severe)    - MILD: No SOB at rest, mild SOB with walking, speaks normally in sentences, can lie down, no retractions, pulse < 100.    - MODERATE: SOB at rest, SOB with minimal exertion and prefers to sit, cannot lie down flat, speaks in phrases, mild retractions, audible wheezing, pulse 100-120.    - SEVERE: Very SOB at rest, speaks in single words, struggling to breathe, sitting hunched forward, retractions, pulse > 120      Mild 5. RECURRENT SYMPTOM: "Have you had difficulty breathing before?" If Yes, ask: "When was the last time?" and "What happened that time?"      Ongoing since October 6. CARDIAC HISTORY: "Do you have any history of heart disease?" (e.g., heart attack, angina, bypass surgery, angioplasty)       7. LUNG HISTORY:  "Do you have any history of lung disease?"  (e.g., pulmonary embolus, asthma, emphysema)     COVID in October 8. CAUSE: "What do you think is causing the breathing problem?"      unsure 9. OTHER SYMPTOMS: "Do you have any other symptoms? (e.g., dizziness, runny nose, cough, chest pain, fever)     Post nasal drip 10. O2 SATURATION MONITOR:  "Do you use an oxygen saturation monitor (pulse oximeter) at home?" If Yes, ask: "What is your reading (oxygen level) today?" "What is your usual oxygen saturation reading?" (e.g., 95%)  Protocols used: Breathing Difficulty-A-AH

## 2022-04-21 ENCOUNTER — Ambulatory Visit (INDEPENDENT_AMBULATORY_CARE_PROVIDER_SITE_OTHER): Payer: 59 | Admitting: Nurse Practitioner

## 2022-04-21 ENCOUNTER — Encounter: Payer: Self-pay | Admitting: Nurse Practitioner

## 2022-04-21 VITALS — BP 118/80 | HR 65 | Temp 97.8°F | Ht 72.99 in | Wt 266.5 lb

## 2022-04-21 DIAGNOSIS — J45909 Unspecified asthma, uncomplicated: Secondary | ICD-10-CM | POA: Insufficient documentation

## 2022-04-21 DIAGNOSIS — J4540 Moderate persistent asthma, uncomplicated: Secondary | ICD-10-CM | POA: Diagnosis not present

## 2022-04-21 MED ORDER — FLUTICASONE FUROATE-VILANTEROL 200-25 MCG/ACT IN AEPB: 1.0000 | INHALATION_SPRAY | Freq: Every day | RESPIRATORY_TRACT | 11 refills | Status: DC

## 2022-04-21 NOTE — Assessment & Plan Note (Addendum)
Suspect some reactive airway present post-Covid, however is also on Benazepril which could be triggering worsening cough.  Is using Albuterol frequently.  At this time will: - Start LABA/ICS inhaler, Breo if covered, educated him on this daily inhaler regimen  and reasoning for use. Goal is to reduce Albuterol use and only have use as rescue.   - Stop Benazepril and monitor BP closely at home, if elevations >130/80 alert provider and will change to ARB which may be more beneficial with his underlying asthma - Continue cough drops at home as needed and good hydration. - Start Zyrtec daily. - Consider recheck thyroid on labs next visit if cough ongoing, recent elevations.  Could consider pulmonary as needed in future.  Return in 4 weeks.

## 2022-04-21 NOTE — Patient Instructions (Signed)

## 2022-04-21 NOTE — Progress Notes (Signed)
BP 118/80   Pulse 65   Temp 97.8 F (36.6 C) (Oral)   Ht 6' 0.99" (1.854 m)   Wt 266 lb 8 oz (120.9 kg)   SpO2 97%   BMI 35.17 kg/m    Subjective:    Patient ID: Anthony Hunter, male    DOB: [birth year], 57 y.o.   MRN: [id number]  HPI: Anthony Hunter is a 57 y.o. male  Chief Complaint  Patient presents with   Wheezing    Bad at night, along with cough and post nasal drip since having Covid in October, he thinks that he may need to see a Pulmonologist.    ALLERGIES/COUGH At baselines has post nasal drainage all the time.  However, wheezing and post nasal drainage since Covid October 20th, 2023.  Post nasal drainage is about the same no worsening.  Has had a chronic cough since Covid -- feels the wheezing is higher up.  Coughing is more prevalent at night.  Uses Albuterol twice a day and cough lozenges at night over past months.  States he does have exercise-induced asthma that waxes and wanes.    Has had multiple ENT surgeries in past.  Does take Benazepril, has been on for many years. Duration: months Runny nose: none Nasal congestion: no Nasal itching: no Sneezing: yes Eye swelling, itching or discharge: yes Post nasal drip: yes Cough: yes Sinus pressure: no  Ear pain: none Ear pressure: none Fever: none Symptoms occur seasonally: no -- spring brings worsening allergies, did get shots in past Symptoms occur perenially: yes Satisfied with current treatment: yes Allergist evaluation in past: yes Allergen injection immunotherapy: yes Recurrent sinus infections: no ENT evaluation in past: yes Known environmental allergy: yes Indoor pets: yes History of asthma: yes Current allergy medications: none at present Treatments attempted: Zyrtec in spring time    Relevant past medical, surgical, family and social history reviewed and updated as indicated. Interim medical history since our last visit reviewed. Allergies and medications reviewed and updated.  Review of Systems   Constitutional:  Negative for activity change, diaphoresis, fatigue and fever.  HENT:  Positive for postnasal drip. Negative for congestion, ear discharge, ear pain, sinus pressure and sinus pain.   Respiratory:  Positive for cough and wheezing. Negative for chest tightness and shortness of breath.   Cardiovascular:  Negative for chest pain, palpitations and leg swelling.  Endocrine: Negative for cold intolerance and heat intolerance.  Musculoskeletal: Negative.   Skin: Negative.   Neurological: Negative.   Psychiatric/Behavioral: Negative.      Per HPI unless specifically indicated above     Objective:    BP 118/80   Pulse 65   Temp 97.8 F (36.6 C) (Oral)   Ht 6' 0.99" (1.854 m)   Wt 266 lb 8 oz (120.9 kg)   SpO2 97%   BMI 35.17 kg/m   Wt Readings from Last 3 Encounters:  04/21/22 266 lb 8 oz (120.9 kg)  01/29/22 265 lb 3.2 oz (120.3 kg)  01/19/22 266 lb (120.7 kg)    Physical Exam Vitals and nursing note reviewed.  Constitutional:      General: He is awake. He is not in acute distress.    Appearance: He is well-developed and well-groomed. He is obese. He is not ill-appearing or toxic-appearing.  HENT:     Head: Normocephalic.     Right Ear: Hearing, tympanic membrane, ear canal and external ear normal.     Left Ear: Hearing, tympanic membrane, ear canal and external  ear normal.     Nose: Rhinorrhea present. Rhinorrhea is clear.     Right Sinus: No maxillary sinus tenderness or frontal sinus tenderness.     Left Sinus: No maxillary sinus tenderness or frontal sinus tenderness.     Mouth/Throat:     Mouth: Mucous membranes are moist.     Pharynx: Posterior oropharyngeal erythema (mild cobblestone appearance) present. No pharyngeal swelling or oropharyngeal exudate.  Eyes:     General: Lids are normal.     Extraocular Movements: Extraocular movements intact.     Conjunctiva/sclera: Conjunctivae normal.  Neck:     Thyroid: No thyromegaly.     Vascular: No carotid  bruit.  Cardiovascular:     Rate and Rhythm: Normal rate and regular rhythm.     Heart sounds: Normal heart sounds.  Pulmonary:     Effort: No accessory muscle usage or respiratory distress.     Breath sounds: Normal breath sounds. No decreased breath sounds, wheezing or rhonchi.  Abdominal:     General: Bowel sounds are normal. There is no distension.     Palpations: Abdomen is soft.     Tenderness: There is no abdominal tenderness.  Musculoskeletal:     Cervical back: Full passive range of motion without pain.     Right lower leg: No edema.     Left lower leg: No edema.  Lymphadenopathy:     Cervical: No cervical adenopathy.  Skin:    General: Skin is warm.     Capillary Refill: Capillary refill takes less than 2 seconds.  Neurological:     Mental Status: He is alert and oriented to person, place, and time.     Deep Tendon Reflexes: Reflexes are normal and symmetric.     Reflex Scores:      Brachioradialis reflexes are 2+ on the right side and 2+ on the left side.      Patellar reflexes are 2+ on the right side and 2+ on the left side. Psychiatric:        Attention and Perception: Attention normal.        Mood and Affect: Mood normal.        Speech: Speech normal.        Behavior: Behavior normal. Behavior is cooperative.        Thought Content: Thought content normal.    Results for orders placed or performed in visit on 12/22/21  Comprehensive metabolic panel  Result Value Ref Range   Glucose 95 70 - 99 mg/dL   BUN 9 6 - 24 mg/dL   Creatinine, Ser 1.00 0.76 - 1.27 mg/dL   eGFR 89 >59 mL/min/1.73   BUN/Creatinine Ratio 9 9 - 20   Sodium 140 134 - 144 mmol/L   Potassium 4.2 3.5 - 5.2 mmol/L   Chloride 102 96 - 106 mmol/L   CO2 23 20 - 29 mmol/L   Calcium 9.4 8.7 - 10.2 mg/dL   Total Protein 6.8 6.0 - 8.5 g/dL   Albumin 4.6 3.8 - 4.9 g/dL   Globulin, Total 2.2 1.5 - 4.5 g/dL   Albumin/Globulin Ratio 2.1 1.2 - 2.2   Bilirubin Total 0.5 0.0 - 1.2 mg/dL   Alkaline  Phosphatase 76 44 - 121 IU/L   AST 24 0 - 40 IU/L   ALT 26 0 - 44 IU/L  Lipid Panel w/o Chol/HDL Ratio  Result Value Ref Range   Cholesterol, Total 250 (H) 100 - 199 mg/dL   Triglycerides 264 (H) 0 - 149  mg/dL   HDL 48 >39 mg/dL   VLDL Cholesterol Cal 49 (H) 5 - 40 mg/dL   LDL Chol Calc (NIH) 153 (H) 0 - 99 mg/dL  CBC with Differential/Platelet  Result Value Ref Range   WBC 5.8 3.4 - 10.8 x10E3/uL   RBC 4.58 4.14 - 5.80 x10E6/uL   Hemoglobin 14.6 13.0 - 17.7 g/dL   Hematocrit 42.8 37.5 - 51.0 %   MCV 93 79 - 97 fL   MCH 31.9 26.6 - 33.0 pg   MCHC 34.1 31.5 - 35.7 g/dL   RDW 12.5 11.6 - 15.4 %   Platelets 260 150 - 450 x10E3/uL   Neutrophils 58 Not Estab. %   Lymphs 30 Not Estab. %   Monocytes 8 Not Estab. %   Eos 3 Not Estab. %   Basos 1 Not Estab. %   Neutrophils Absolute 3.4 1.4 - 7.0 x10E3/uL   Lymphocytes Absolute 1.7 0.7 - 3.1 x10E3/uL   Monocytes Absolute 0.4 0.1 - 0.9 x10E3/uL   EOS (ABSOLUTE) 0.2 0.0 - 0.4 x10E3/uL   Basophils Absolute 0.0 0.0 - 0.2 x10E3/uL   Immature Granulocytes 0 Not Estab. %   Immature Grans (Abs) 0.0 0.0 - 0.1 x10E3/uL      Assessment & Plan:   Problem List Items Addressed This Visit       Respiratory   Asthma - Primary    Suspect some reactive airway present post-Covid, however is also on Benazepril which could be triggering worsening cough.  Is using Albuterol frequently.  At this time will: - Start LABA/ICS inhaler, Breo if covered, educated him on this daily inhaler regimen  and reasoning for use. Goal is to reduce Albuterol use and only have use as rescue.   - Stop Benazepril and monitor BP closely at home, if elevations >130/80 alert provider and will change to ARB which may be more beneficial with his underlying asthma - Continue cough drops at home as needed and good hydration. - Start Zyrtec daily. - Consider recheck thyroid on labs next visit if cough ongoing, recent elevations.  Could consider pulmonary as needed in future.   Return in 4 weeks.      Relevant Medications   fluticasone furoate-vilanterol (BREO ELLIPTA) 200-25 MCG/ACT AEPB     Follow up plan: Return in about 4 weeks (around 05/19/2022) for ASTHMA.

## 2022-05-19 ENCOUNTER — Ambulatory Visit: Payer: 59 | Admitting: Family Medicine

## 2022-06-04 ENCOUNTER — Ambulatory Visit (INDEPENDENT_AMBULATORY_CARE_PROVIDER_SITE_OTHER): Payer: 59 | Admitting: Family Medicine

## 2022-06-04 ENCOUNTER — Encounter: Payer: Self-pay | Admitting: Family Medicine

## 2022-06-04 VITALS — BP 134/87 | HR 82 | Temp 98.5°F | Ht 74.0 in | Wt 270.4 lb

## 2022-06-04 DIAGNOSIS — N5089 Other specified disorders of the male genital organs: Secondary | ICD-10-CM | POA: Diagnosis not present

## 2022-06-04 DIAGNOSIS — J4540 Moderate persistent asthma, uncomplicated: Secondary | ICD-10-CM

## 2022-06-04 NOTE — Assessment & Plan Note (Signed)
Will treat with breo daily. Call with any concerns. Continue to monitor. Follow up 2 weeks.

## 2022-06-04 NOTE — Addendum Note (Signed)
Addended by: Dorcas Carrow on: 06/04/2022 03:45 PM   Modules accepted: Orders

## 2022-06-04 NOTE — Progress Notes (Signed)
Normal spiro.

## 2022-06-04 NOTE — Progress Notes (Addendum)
BP 134/87   Pulse 82   Temp 98.5 F (36.9 C)   Ht  (1.88 m)   Wt 270 lb 6.4 oz (122.7 kg)   SpO2 98%   BMI 34.72 kg/m    Subjective:    Patient ID: Anthony Hunter, male    DOB: 1965-11-08, 57 y.o.   MRN: [id number]  HPI: Anthony Hunter is a 57 y.o. male  Chief Complaint  Patient presents with   Asthma    F/up   ASTHMA Asthma status: better Satisfied with current treatment?: no Albuterol/rescue inhaler frequency: 1x a night Dyspnea frequency: never Wheezing frequency: just at night Cough frequency: just at night Nocturnal symptom frequency: nightly Limitation of activity: yes Current upper respiratory symptoms: no Aerochamber/spacer use: no Visits to ER or Urgent Care in past year: no Pneumovax: Not up to Date Influenza: Up to Date  Has had scrotal swelling for about the last couple of years. 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 Now notices some lumps behind his testicles. No significant pain, but they are not going away.   Relevant past medical, surgical, family and social history reviewed and updated as indicated. Interim medical history since our last visit reviewed. Allergies and medications reviewed and updated.  Review of Systems  Constitutional: Negative.   Respiratory:  Positive for shortness of breath. Negative for apnea, cough, choking, chest tightness, wheezing and stridor.   Cardiovascular: Negative.   Gastrointestinal: Negative.   Musculoskeletal: Negative.   Neurological: Negative.   Psychiatric/Behavioral: Negative.      Per HPI unless specifically indicated above     Objective:    BP 134/87   Pulse 82   Temp 98.5 F (36.9 C)   Ht  (1.88 m)   Wt 270 lb 6.4 oz (122.7 kg)   SpO2 98%   BMI 34.72 kg/m   Wt Readings from Last 3 Encounters:  06/04/22 270 lb 6.4 oz (122.7 kg)  04/21/22 266 lb 8 oz (120.9 kg)  01/29/22 265 lb 3.2 oz (120.3 kg)    Physical Exam Vitals and nursing note reviewed.  Constitutional:      General:  He is not in acute distress.    Appearance: Normal appearance. He is obese. He is not ill-appearing, toxic-appearing or diaphoretic.  HENT:     Head: Normocephalic and atraumatic.     Right Ear: External ear normal.     Left Ear: External ear normal.     Nose: Nose normal.     Mouth/Throat:     Mouth: Mucous membranes are moist.     Pharynx: Oropharynx is clear.  Eyes:     General: No scleral icterus.       Right eye: No discharge.        Left eye: No discharge.     Extraocular Movements: Extraocular movements intact.     Conjunctiva/sclera: Conjunctivae normal.     Pupils: Pupils are equal, round, and reactive to light.  Cardiovascular:     Rate and Rhythm: Normal rate and regular rhythm.     Pulses: Normal pulses.     Heart sounds: Normal heart sounds. No murmur heard.    No friction rub. No gallop.  Pulmonary:     Effort: Pulmonary effort is normal. No respiratory distress.     Breath sounds: Normal breath sounds. No stridor. No wheezing, rhonchi or rales.  Chest:     Chest wall: No tenderness.  Musculoskeletal:  General: Normal range of motion.     Cervical back: Normal range of motion and neck supple.  Skin:    General: Skin is warm and dry.     Capillary Refill: Capillary refill takes less than 2 seconds.     Coloration: Skin is not jaundiced or pale.     Findings: No bruising, erythema, lesion or rash.  Neurological:     General: No focal deficit present.     Mental Status: He is alert and oriented to person, place, and time. Mental status is at baseline.  Psychiatric:        Mood and Affect: Mood normal.        Behavior: Behavior normal.        Thought Content: Thought content normal.        Judgment: Judgment normal.     Results for orders placed or performed in visit on 12/22/21  Comprehensive metabolic panel  Result Value Ref Range   Glucose 95 70 - 99 mg/dL   BUN 9 6 - 24 mg/dL   Creatinine, Ser 4.54 0.76 - 1.27 mg/dL   eGFR 89 >09 WJ/XBJ/4.78    BUN/Creatinine Ratio 9 9 - 20   Sodium 140 134 - 144 mmol/L   Potassium 4.2 3.5 - 5.2 mmol/L   Chloride 102 96 - 106 mmol/L   CO2 23 20 - 29 mmol/L   Calcium 9.4 8.7 - 10.2 mg/dL   Total Protein 6.8 6.0 - 8.5 g/dL   Albumin 4.6 3.8 - 4.9 g/dL   Globulin, Total 2.2 1.5 - 4.5 g/dL   Albumin/Globulin Ratio 2.1 1.2 - 2.2   Bilirubin Total 0.5 0.0 - 1.2 mg/dL   Alkaline Phosphatase 76 44 - 121 IU/L   AST 24 0 - 40 IU/L   ALT 26 0 - 44 IU/L  Lipid Panel w/o Chol/HDL Ratio  Result Value Ref Range   Cholesterol, Total 250 (H) 100 - 199 mg/dL   Triglycerides 295 (H) 0 - 149 mg/dL   HDL 48 >62 mg/dL   VLDL Cholesterol Cal 49 (H) 5 - 40 mg/dL   LDL Chol Calc (NIH) 130 (H) 0 - 99 mg/dL  CBC with Differential/Platelet  Result Value Ref Range   WBC 5.8 3.4 - 10.8 x10E3/uL   RBC 4.58 4.14 - 5.80 x10E6/uL   Hemoglobin 14.6 13.0 - 17.7 g/dL   Hematocrit 86.5 78.4 - 51.0 %   MCV 93 79 - 97 fL   MCH 31.9 26.6 - 33.0 pg   MCHC 34.1 31.5 - 35.7 g/dL   RDW 69.6 29.5 - 28.4 %   Platelets 260 150 - 450 x10E3/uL   Neutrophils 58 Not Estab. %   Lymphs 30 Not Estab. %   Monocytes 8 Not Estab. %   Eos 3 Not Estab. %   Basos 1 Not Estab. %   Neutrophils Absolute 3.4 1.4 - 7.0 x10E3/uL   Lymphocytes Absolute 1.7 0.7 - 3.1 x10E3/uL   Monocytes Absolute 0.4 0.1 - 0.9 x10E3/uL   EOS (ABSOLUTE) 0.2 0.0 - 0.4 x10E3/uL   Basophils Absolute 0.0 0.0 - 0.2 x10E3/uL   Immature Granulocytes 0 Not Estab. %   Immature Grans (Abs) 0.0 0.0 - 0.1 x10E3/uL      Assessment & Plan:   Problem Hunter Items Addressed This Visit       Respiratory   Asthma - Primary    Will treat with breo daily. Call with any concerns. Continue to monitor. Follow up 2 weeks.  Relevant Orders   Spirometry with graph (Completed)   Other Visit Diagnoses     Scrotal swelling       Relevant Orders   US Scrotum        Follow up plan: Return As scheduled.

## 2022-06-09 ENCOUNTER — Ambulatory Visit
Admission: RE | Admit: 2022-06-09 | Discharge: 2022-06-09 | Disposition: A | Discharge status: Home / Self Care | Payer: 59 | Source: Ambulatory Visit | Attending: Family Medicine | Admitting: Family Medicine

## 2022-06-09 DIAGNOSIS — N5089 Other specified disorders of the male genital organs: Secondary | ICD-10-CM | POA: Insufficient documentation

## 2022-06-13 ENCOUNTER — Encounter: Payer: Self-pay | Admitting: Family Medicine

## 2022-06-14 ENCOUNTER — Other Ambulatory Visit: Payer: Self-pay | Admitting: Family Medicine

## 2022-06-14 DIAGNOSIS — L720 Epidermal cyst: Secondary | ICD-10-CM

## 2022-06-17 ENCOUNTER — Other Ambulatory Visit: Payer: Self-pay | Admitting: Family Medicine

## 2022-06-17 NOTE — Telephone Encounter (Signed)
Requested Prescriptions  Pending Prescriptions Disp Refills   benazepril (LOTENSIN) 40 MG tablet [Pharmacy Med Name: BENAZEPRIL HCL 40 MG TABLET] 90 tablet 1    Sig: TAKE 1 TABLET BY MOUTH EVERY DAY     Cardiovascular:  ACE Inhibitors Passed - 06/17/2022  1:51 AM      Passed - Cr in normal range and within 180 days    Creatinine  Date Value Ref Range Status  04/12/2013 0.94 0.60 - 1.30 mg/dL Final   Creatinine, Ser  Date Value Ref Range Status  12/22/2021 1.00 0.76 - 1.27 mg/dL Final         Passed - K in normal range and within 180 days    Potassium  Date Value Ref Range Status  12/22/2021 4.2 3.5 - 5.2 mmol/L Final  04/12/2013 4.7 3.5 - 5.1 mmol/L Final         Passed - Patient is not pregnant      Passed - Last BP in normal range    BP Readings from Last 1 Encounters:  06/04/22 134/87         Passed - Valid encounter within last 6 months    Recent Outpatient Visits           1 week ago Moderate persistent asthma without complication   [place] [facility] [place], [name], DO   1 month ago Moderate persistent asthma without complication   [place] [facility] [place], [name], NP   5 months ago Primary hypertension   [place] [facility] [place], [place], DO   1 year ago Routine general medical examination at a health care facility   [facility] [place], [name], DO   1 year ago Primary hypertension   [place] [facility] [place], [name], DO       Future Appointments             In 5 days Dorcas Carrow, DO [place] [name]

## 2022-06-22 ENCOUNTER — Other Ambulatory Visit: Payer: Self-pay | Admitting: Family Medicine

## 2022-06-22 ENCOUNTER — Encounter: Payer: 59 | Admitting: Family Medicine

## 2022-06-22 DIAGNOSIS — F3342 Major depressive disorder, recurrent, in full remission: Secondary | ICD-10-CM

## 2022-06-22 NOTE — Telephone Encounter (Signed)
Requested Prescriptions  Pending Prescriptions Disp Refills   PARoxetine (PAXIL) 20 MG tablet [Pharmacy Med Name: PAROXETINE HCL 20 MG TABLET] 90 tablet 0    Sig: TAKE 1 TABLET BY MOUTH EVERY DAY     Psychiatry:  Antidepressants - SSRI Passed - 06/22/2022  2:34 AM      Passed - Completed PHQ-2 or PHQ-9 in the last 360 days      Passed - Valid encounter within last 6 months    Recent Outpatient Visits           2 weeks ago Moderate persistent asthma without complication   [place] [facility] [place], [name], DO   2 months ago Moderate persistent asthma without complication   [place] [facility] [place], Corrie Dandy T, NP   6 months ago Primary hypertension   [place] [facility] [place], Megan [place], DO   1 year ago Routine general medical examination at a health care facility   [facility] [place], [name], DO   1 year ago Primary hypertension   [place] [facility] [place], [place], DO       Future Appointments             In 1 week [name], [name], DO [place] [facility], [name]             amLODipine (NORVASC) 10 MG tablet [Pharmacy Med Name: AMLODIPINE BESYLATE 10 MG TAB] 90 tablet 0    Sig: TAKE 1 TABLET BY MOUTH EVERY DAY     Cardiovascular: Calcium Channel Blockers 2 Passed - 06/22/2022  2:34 AM      Passed - Last BP in normal range    BP Readings from Last 1 Encounters:  06/04/22 134/87         Passed - Last Heart Rate in normal range    Pulse Readings from Last 1 Encounters:  06/04/22 82         Passed - Valid encounter within last 6 months    Recent Outpatient Visits           2 weeks ago Moderate persistent asthma without complication   [place] [facility] [place], [name], DO   2 months ago Moderate persistent asthma without complication   [place] [facility] [place], Corrie Dandy T, NP   6 months ago Primary hypertension    [place] [facility] [place], [place], DO   1 year ago Routine general medical examination at a health care facility   [facility] [place], [name], DO   1 year ago Primary hypertension   [place] [facility] [place], [name], DO       Future Appointments             In 1 week [name], [name], DO [facility], [name]

## 2022-06-25 ENCOUNTER — Encounter: Payer: Self-pay | Admitting: Surgery

## 2022-06-25 ENCOUNTER — Ambulatory Visit (INDEPENDENT_AMBULATORY_CARE_PROVIDER_SITE_OTHER): Payer: 59 | Admitting: Surgery

## 2022-06-25 VITALS — BP 133/93 | HR 94 | Temp 98.5°F | Ht 73.0 in | Wt 267.0 lb

## 2022-06-25 DIAGNOSIS — N5089 Other specified disorders of the male genital organs: Secondary | ICD-10-CM
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
## 2022-06-25 NOTE — Patient Instructions (Signed)
If you have any concerns or questions, please feel free to call our office. See follow up appointment.  Excision of Skin Lesions Excision of a skin lesion is the removal of a section of skin by making small incisions in the skin. Through this process, the lesion is completely removed. This procedure is often done to treat or prevent cancer or infection. It may also be done to improve cosmetic appearance. You may have this procedure to remove: Cancerous (malignant) growths, such as basal cell carcinoma, squamous cell carcinoma, or melanoma. Noncancerous (benign) growths, such as a cyst or lipoma. Growths, such as moles or skin tags, which may be removed for cosmetic reasons. Various excision or surgical techniques may be used depending on your condition, the location of the lesion, and your overall health. Tell your health care provider about: Any allergies you have. All medicines you are taking, including vitamins, herbs, eye drops, creams, and over-the-counter medicines. Any problems you or family members have had with anesthetic medicines. Any bleeding problems you have. Any surgeries you have had. Any medical conditions you have. Whether you are pregnant or may be pregnant. What are the risks? Generally, this is a safe procedure. However, problems may occur, including: Bleeding. Infection. Scarring. Recurrence of the cyst, lipoma, or cancer. Allergic reaction to anesthetics, surgical materials, or ointments. Damage to nerves, blood vessels, muscles, or other structures. What happens before the procedure? Medicines Ask your health care provider about: Changing or stopping your regular medicines. This is especially important if you are taking diabetes medicines or blood thinners. Taking medicines such as aspirin and ibuprofen. These medicines can thin your blood. Do not take these medicines unless your health care provider tells you to take them. Taking over-the-counter medicines,  vitamins, herbs, and supplements. General instructions Do not use any products that contain nicotine or tobacco. These products include cigarettes, chewing tobacco, and vaping devices, such as e-cigarettes. If you need help quitting, ask your health care provider. Follow instructions from your health care provider about eating or drinking restrictions. Ask your health care provider: How your surgery site will be marked. What steps will be taken to help prevent infection. These steps may include: Removing hair at the surgery site. Washing skin with a germ-killing soap. Taking antibiotic medicine. Ask your health care provider if you will need someone to take you home from the hospital or clinic after the procedure. What happens during the procedure?  You will be given a medicine to numb the area (local anesthetic). Your health care provider will remove the lesions using one of the following excision techniques. Complete surgical excision. This procedure may be done to treat a cancerous growth or a noncancerous cyst or lesion. A small scalpel or scissors will be used to gently cut around and under the lesion until it is completely removed. If bleeding occurs, it will be stopped with a device that delivers heat (electrocautery). The edges of the wound may be stitched (sutured) together. A bandage (dressing) will be applied. Samples will be sent to a lab for testing. Excision of a cyst. An incision will be made on the cyst. The entire cyst will be removed through the incision. The incision may be closed with sutures. Shave excision. This may be done to remove a mole or other small growths. A small blade or scalpel will be used to shave off the lesion. The wound is usually left to heal on its own without sutures. The sample may be sent to a lab for testing.  Punch excision. This may be done to completely remove a mole or other small growths. A small tool that is like a cookie cutter or a hole  punch is used to cut a circle shape out of the skin. The outer edges of the skin will be sutured together. The sample may be sent to a lab for testing. Mohs micrographic surgery. This is usually done to treat skin cancer. This type of excision is mostly used on the face and ears. This procedure is minimally invasive, and it ensures the best cosmetic outcome. A scalpel or a loop instrument will be used to remove layers of the lesion until all the abnormal or cancerous tissue has been removed. The wound may be sutured, depending on its size. The tissue will be checked under a microscope right away. The procedure may vary among health care providers and hospitals. At the end of any of these procedures, antibiotic ointment will be applied as needed. What happens after the procedure? Talk with your health care provider to discuss any test results, treatment options, and if necessary, the need for more tests. Keep all follow-up visits. This is important. Summary Excision of a skin lesion is the removal of a section of skin by making small incisions in the skin. This procedure is often done to treat or prevent skin cancer, remove benign growths, or it may be done to improve cosmetic appearance. Various excision or surgical techniques may be used depending on your condition, the location of the lesion, and your overall health. After the procedure, talk with your health care provider to discuss any test results, treatment options, and if necessary, the need for more tests. Keep all follow-up visits. This is important. This information is not intended to replace advice given to you by your health care provider. Make sure you discuss any questions you have with your health care provider. Document Revised: 09/02/2020 Document Reviewed: 09/02/2020 Elsevier Patient Education  2023 [employer].

## 2022-07-01 ENCOUNTER — Encounter: Payer: 59 | Admitting: Family Medicine

## 2022-07-06 ENCOUNTER — Encounter: Payer: 59 | Admitting: Family Medicine

## 2022-07-09 ENCOUNTER — Ambulatory Visit (INDEPENDENT_AMBULATORY_CARE_PROVIDER_SITE_OTHER): Payer: 59 | Admitting: Surgery

## 2022-07-09 ENCOUNTER — Encounter: Payer: Self-pay | Admitting: Surgery

## 2022-07-09 ENCOUNTER — Other Ambulatory Visit: Payer: Self-pay | Admitting: Surgery

## 2022-07-09 VITALS — BP 133/85 | HR 58 | Temp 98.0°F | Ht 74.0 in | Wt 266.0 lb

## 2022-07-09 DIAGNOSIS — N5089 Other specified disorders of the male genital organs: Secondary | ICD-10-CM

## 2022-07-09 NOTE — Progress Notes (Signed)
  Procedure Date:  07/09/2022  Pre-operative Diagnosis:  Perineal cyst  Post-operative Diagnosis:  Perineal cyst, 10 mm.  Procedure:  Excision of perineal cyst, layered closure of 2 cm incision.  Surgeon:  Howie Ill, MD  Anesthesia:  3 ml of 1% lidocaine with epi  Estimated Blood Loss:  3 ml  Specimens:  perineal cyst  Complications:  None  Indications for Procedure:  [name] with diagnosis of a symptomatic perineal cyst.  The patient wishes to have this excised. The risks of bleeding, abscess or infection, injury to surrounding structures, and need for further procedures were all discussed with the patient and he was willing to proceed.  Description of Procedure: The patient was correctly identified at bedside.  The patient was placed supine.  Appropriate time-outs were performed.  The patient's perineum was prepped and draped in usual sterile fashion.  Local anesthetic was infused intradermally.  A 2 cm elliptical incision was made over the cyst, and scalpel was used to dissect down the skin and subcutaneous tissue.  Skin flaps were created sharply, and then the cyst was excised intact.  It was sent off to pathology.  The cavity was then irrigated and hemostasis was assured with manual pressure.  The wound was then closed in two layers using 3-0 Vicryl and 4-0 Monocryl.  The incision was cleaned and sealed with DermaBond.  The patient tolerated the procedure well and all sharps were appropriately disposed of at the end of the case.  --Patient may shower tomorrow. --May take Tylenol or ibuprofen for pain. --Follow up in 10 days.  Howie Ill, MD

## 2022-07-09 NOTE — Patient Instructions (Signed)
If you have any concerns or questions, please feel free to call our office. See follow up appointment.   Hydrocele, Adult A hydrocele is a collection of fluid in the loose pouch of skin that holds the testicles (scrotum). It can occur in one or both testicles. This may happen because: The amount of fluid produced in the scrotum is not absorbed by the rest of the body. Fluid from the abdomen fills the scrotum. Normally, the testicles develop in the abdomen and then drop into the scrotum before birth. The tube that the testicles travel through usually closes after the testicles drop. If the tube does not close, fluid from the abdomen can fill the scrotum. This is not very common in adults. What are the causes? A hydrocele may be caused by: An injury to the scrotum. An infection. Decreased blood flow to the scrotum. Twisting of a testicle (testicular torsion). A birth defect. A tumor or cancer of the testicle. Sometimes, the cause is not known. What are the signs or symptoms? A hydrocele feels like a water-filled balloon. It may also feel heavy. Other symptoms include: Swelling of the scrotum. The swelling may decrease when you lie down. You may also notice more swelling at night than in the morning. This is called a communicating hydrocele, in which the fluid in the scrotum goes back into the abdominal cavity when the position of the scrotum changes. Swelling of the groin. Mild discomfort in the scrotum. Pain. This can develop if the hydrocele was caused by infection or twisting. The larger the hydrocele, the more likely you are to have pain. Swelling may also cause pain. How is this diagnosed? This condition may be diagnosed based on a physical exam and your medical history. You may also have tests, including: Imaging tests, such as an ultrasound. A transillumination test. This test takes place in a dark room where a light is placed on the skin of the scrotum. Clear liquid will not impede the  light and the scrotum will be illuminated. This helps a health care provider distinguish a hydrocele from a tumor. Blood or urine tests. How is this treated? Most hydroceles go away on their own. If you have no discomfort or pain, your health care provider may suggest close monitoring of your condition until the condition goes away or symptoms develop. This is called watch and wait or watchful waiting. If treatment is needed, it may include: Treating an underlying condition. This may include taking an antibiotic medicine to treat an infection. Having surgery to stop fluid from collecting in the scrotum. Having surgery to drain the fluid. Surgery may include: Hydrocelectomy. For this procedure, an incision is made in the scrotum to remove the fluid. Needle aspiration. A needle is used to drain fluid. However, the fluid buildup will come back quickly and may lead to an infection of the scrotum. This is rarely done. Follow these instructions at home: Medicines Take over-the-counter and prescription medicines only as told by your health care provider. If you were prescribed an antibiotic medicine, take it as told by your health care provider. Do not stop taking the antibiotic even if you start to feel better. General instructions Watch the hydrocele for any changes. Keep all follow-up visits. This is important. Contact a health care provider if: You notice any changes in the hydrocele. The swelling in your scrotum or groin gets worse. The hydrocele becomes red, firm, painful, or tender to the touch. You have a fever. Get help right away if you:  Develop a lot of pain or your pain becomes worse. Have chills. Have a high fever. Summary A hydrocele is a collection of fluid in the loose pouch of skin that holds the testicles (scrotum). A hydrocele can cause swelling, discomfort, and pain. In adults, the cause of a hydrocele may not be known. However, it is sometimes caused by an infection or the  twisting of a testicle. Hydroceles often go away on their own. If a hydrocele causes pain, treating the underlying cause may be needed to ease the pain. This information is not intended to replace advice given to you by your health care provider. Make sure you discuss any questions you have with your health care provider. Document Revised: 09/18/2020 Document Reviewed: 09/18/2020 Elsevier Patient Education  2024 [employer].

## 2022-07-13 ENCOUNTER — Encounter: Payer: Self-pay | Admitting: Family Medicine

## 2022-07-13 ENCOUNTER — Ambulatory Visit (INDEPENDENT_AMBULATORY_CARE_PROVIDER_SITE_OTHER): Payer: 59 | Admitting: Family Medicine

## 2022-07-13 VITALS — BP 120/80 | HR 60 | Temp 97.9°F | Ht 74.0 in | Wt 269.6 lb

## 2022-07-13 DIAGNOSIS — M47816 Spondylosis without myelopathy or radiculopathy, lumbar region: Secondary | ICD-10-CM | POA: Diagnosis not present

## 2022-07-13 DIAGNOSIS — Z23 Encounter for immunization: Secondary | ICD-10-CM

## 2022-07-13 DIAGNOSIS — I10 Essential (primary) hypertension: Secondary | ICD-10-CM | POA: Diagnosis not present

## 2022-07-13 DIAGNOSIS — J4540 Moderate persistent asthma, uncomplicated: Secondary | ICD-10-CM

## 2022-07-13 DIAGNOSIS — Z0000 Encounter for general adult medical examination without abnormal findings: Secondary | ICD-10-CM | POA: Diagnosis not present

## 2022-07-13 DIAGNOSIS — F3342 Major depressive disorder, recurrent, in full remission: Secondary | ICD-10-CM

## 2022-07-13 DIAGNOSIS — E782 Mixed hyperlipidemia: Secondary | ICD-10-CM

## 2022-07-13 DIAGNOSIS — Z1211 Encounter for screening for malignant neoplasm of colon: Secondary | ICD-10-CM

## 2022-07-13 LAB — URINALYSIS, ROUTINE W REFLEX MICROSCOPIC
Bilirubin, UA: NEGATIVE
Glucose, UA: NEGATIVE
Ketones, UA: NEGATIVE
Leukocytes,UA: NEGATIVE
Nitrite, UA: NEGATIVE
Protein,UA: NEGATIVE
RBC, UA: NEGATIVE
Specific Gravity, UA: 1.01 (ref 1.005–1.030)
Urobilinogen, Ur: 0.2 mg/dL (ref 0.2–1.0)
pH, UA: 7 (ref 5.0–7.5)

## 2022-07-13 LAB — MICROALBUMIN, URINE WAIVED
Creatinine, Urine Waived: 10 mg/dL (ref 10–300)
Microalb, Ur Waived: 10 mg/L (ref 0–19)

## 2022-07-13 MED ORDER — PAROXETINE HCL 20 MG PO TABS: 20.0000 mg | ORAL_TABLET | Freq: Every day | ORAL | 1 refills | Status: DC

## 2022-07-13 MED ORDER — AMLODIPINE BESYLATE 10 MG PO TABS: 10.0000 mg | ORAL_TABLET | Freq: Every day | ORAL | 1 refills | Status: DC

## 2022-07-13 MED ORDER — TRAZODONE HCL 50 MG PO TABS: 25.0000 mg | ORAL_TABLET | Freq: Every evening | ORAL | 3 refills | Status: DC | PRN

## 2022-07-13 MED ORDER — FLUTICASONE FUROATE-VILANTEROL 200-25 MCG/ACT IN AEPB: 1.0000 | INHALATION_SPRAY | Freq: Every day | RESPIRATORY_TRACT | 11 refills | Status: DC

## 2022-07-13 MED ORDER — BENAZEPRIL HCL 40 MG PO TABS: 40.0000 mg | ORAL_TABLET | Freq: Every day | ORAL | 1 refills | Status: DC

## 2022-07-13 MED ORDER — ALBUTEROL SULFATE HFA 108 (90 BASE) MCG/ACT IN AERS: INHALATION_SPRAY | RESPIRATORY_TRACT | 2 refills | Status: DC

## 2022-07-13 MED ORDER — OMEPRAZOLE 20 MG PO CPDR: 20.0000 mg | DELAYED_RELEASE_CAPSULE | Freq: Every day | ORAL | 2 refills | Status: DC
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
## 2022-07-13 NOTE — Assessment & Plan Note (Signed)
Continues to cause him a lot of issues. Not better with injections. Would like to have a consult with neurosurgery- referral generated today.

## 2022-07-13 NOTE — Assessment & Plan Note (Signed)
Under good control on current regimen. Continue current regimen. Continue to monitor. Call with any concerns. Refills given. Labs drawn today.   

## 2022-07-14 ENCOUNTER — Other Ambulatory Visit: Payer: Self-pay

## 2022-07-14 ENCOUNTER — Telehealth: Payer: Self-pay

## 2022-07-14 DIAGNOSIS — Z86010 Personal history of colonic polyps: Secondary | ICD-10-CM

## 2022-07-14 LAB — LIPID PANEL W/O CHOL/HDL RATIO
Cholesterol, Total: 258 mg/dL — ABNORMAL HIGH (ref 100–199)
HDL: 41 mg/dL (ref >39)
LDL Chol Calc (NIH): 134 mg/dL — ABNORMAL HIGH (ref 0–99)
Triglycerides: 457 mg/dL — ABNORMAL HIGH (ref 0–149)
VLDL Cholesterol Cal: 83 mg/dL — ABNORMAL HIGH (ref 5–40)

## 2022-07-14 LAB — COMPREHENSIVE METABOLIC PANEL
ALT: 26 IU/L (ref 0–44)
AST: 20 IU/L (ref 0–40)
Albumin/Globulin Ratio: 2 (ref 1.2–2.2)
Albumin: 4.4 g/dL (ref 3.8–4.9)
Alkaline Phosphatase: 98 IU/L (ref 44–121)
BUN/Creatinine Ratio: 16 (ref 9–20)
BUN: 17 mg/dL (ref 6–24)
Bilirubin Total: 0.4 mg/dL (ref 0.0–1.2)
CO2: 21 mmol/L (ref 20–29)
Calcium: 9.3 mg/dL (ref 8.7–10.2)
Chloride: 101 mmol/L (ref 96–106)
Creatinine, Ser: 1.05 mg/dL (ref 0.76–1.27)
Globulin, Total: 2.2 g/dL (ref 1.5–4.5)
Glucose: 90 mg/dL (ref 70–99)
Potassium: 3.9 mmol/L (ref 3.5–5.2)
Sodium: 138 mmol/L (ref 134–144)
Total Protein: 6.6 g/dL (ref 6.0–8.5)
eGFR: 84 mL/min/{1.73_m2} (ref >59)

## 2022-07-14 LAB — CBC WITH DIFFERENTIAL/PLATELET
Basophils Absolute: 0 10*3/uL (ref 0.0–0.2)
Basos: 1 %
EOS (ABSOLUTE): 0.2 10*3/uL (ref 0.0–0.4)
Eos: 3 %
Hematocrit: 41.6 % (ref 37.5–51.0)
Hemoglobin: 14.2 g/dL (ref 13.0–17.7)
Immature Grans (Abs): 0 10*3/uL (ref 0.0–0.1)
Immature Granulocytes: 0 %
Lymphocytes Absolute: 1.9 10*3/uL (ref 0.7–3.1)
Lymphs: 34 %
MCH: 31.5 pg (ref 26.6–33.0)
MCHC: 34.1 g/dL (ref 31.5–35.7)
MCV: 92 fL (ref 79–97)
Monocytes Absolute: 0.5 10*3/uL (ref 0.1–0.9)
Monocytes: 8 %
Neutrophils Absolute: 3 10*3/uL (ref 1.4–7.0)
Neutrophils: 54 %
Platelets: 231 10*3/uL (ref 150–450)
RBC: 4.51 x10E6/uL (ref 4.14–5.80)
RDW: 12.9 % (ref 11.6–15.4)
WBC: 5.6 10*3/uL (ref 3.4–10.8)

## 2022-07-14 LAB — PSA: Prostate Specific Ag, Serum: 1.5 ng/mL (ref 0.0–4.0)

## 2022-07-14 LAB — TSH: TSH: 4.38 u[IU]/mL (ref 0.450–4.500)
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
## 2022-07-14 MED ORDER — NA SULFATE-K SULFATE-MG SULF 17.5-3.13-1.6 GM/177ML PO SOLN: 1.0000 | Freq: Once | ORAL | 0 refills | Status: AC

## 2022-07-14 NOTE — Telephone Encounter (Signed)
Gastroenterology Pre-Procedure Review  Request Date: 10/27/22 Requesting Physician: Dr. Tobi Bastos  PATIENT REVIEW QUESTIONS: The patient responded to the following health history questions as indicated:    1. Are you having any GI issues? no 2. Do you have a personal history of Polyps? yes (last colonoscopy was with dr Tobi Bastos on 10/18/2017) 3. Do you have a family history of Colon Cancer or Polyps? no 4. Diabetes Mellitus? no 5. Joint replacements in the past 12 months?no 6. Major health problems in the past 3 months?no 7. Any artificial heart valves, MVP, or defibrillator?no    MEDICATIONS & ALLERGIES:    Patient reports the following regarding taking any anticoagulation/antiplatelet therapy:   Plavix, Coumadin, Eliquis, Xarelto, Lovenox, Pradaxa, Brilinta, or Effient? no Aspirin? no  Patient confirms/reports the following medications:  Current Outpatient Medications  Medication Sig Dispense Refill   albuterol (VENTOLIN HFA) 108 (90 Base) MCG/ACT inhaler TAKE 2 PUFFS BY MOUTH EVERY 6 HOURS AS NEEDED FOR WHEEZE OR SHORTNESS OF BREATH 18 each 2   amLODipine (NORVASC) 10 MG tablet Take 1 tablet (10 mg total) by mouth daily. 90 tablet 1   benazepril (LOTENSIN) 40 MG tablet Take 1 tablet (40 mg total) by mouth daily. 90 tablet 1   EPINEPHrine (EPIPEN 2-PAK) 0.3 mg/0.3 mL IJ SOAJ injection Inject 0.3 mg into the muscle as needed for anaphylaxis. 2 each 1   fluticasone furoate-vilanterol (BREO ELLIPTA) 200-25 MCG/ACT AEPB Inhale 1 puff into the lungs daily. 1 each 11   MAGNESIUM PO Take 400 mg by mouth daily.     Multiple Vitamins-Minerals (MULTIVITAMIN GUMMIES ADULT PO) Take 2 each by mouth daily.     Multiple Vitamins-Minerals (ZINC PO) Take 1 tablet by mouth daily.     Omega-3 Fatty Acids (FISH OIL) 1000 MG CAPS Take 1 capsule by mouth.     omeprazole (PRILOSEC) 20 MG capsule Take 1 capsule (20 mg total) by mouth daily. 90 capsule 2   PARoxetine (PAXIL) 20 MG tablet Take 1 tablet (20 mg total)  by mouth daily. 90 tablet 1   traZODone (DESYREL) 50 MG tablet Take 0.5-1 tablets (25-50 mg total) by mouth at bedtime as needed for sleep. 30 tablet 3   VITAMIN D PO Take 10,000 Units by mouth daily.     No current facility-administered medications for this visit.    Patient confirms/reports the following allergies:  Allergies  Allergen Reactions   Shrimp (Diagnostic) Anaphylaxis   Shrimp [Shellfish Allergy]     Patient states he's only allergic to shrimp, but can eat crab, lobster, etc. No issues with iodine solutions    No orders of the defined types were placed in this encounter.   AUTHORIZATION INFORMATION Primary Insurance: 1D#: Group #:  Secondary Insurance: 1D#: Group #:  SCHEDULE INFORMATION: Date: 10/27/22 Time: Location: [facility]

## 2022-07-20 ENCOUNTER — Encounter: Payer: Self-pay | Admitting: Physician Assistant

## 2022-07-20 ENCOUNTER — Ambulatory Visit (INDEPENDENT_AMBULATORY_CARE_PROVIDER_SITE_OTHER): Payer: 59 | Admitting: Physician Assistant

## 2022-07-20 VITALS — BP 146/96 | HR 60 | Temp 98.0°F | Ht 74.0 in | Wt 266.0 lb

## 2022-07-20 DIAGNOSIS — Z09 Encounter for follow-up examination after completed treatment for conditions other than malignant neoplasm: Secondary | ICD-10-CM | POA: Diagnosis not present

## 2022-07-20 DIAGNOSIS — N5089 Other specified disorders of the male genital organs: Secondary | ICD-10-CM | POA: Diagnosis not present

## 2022-07-20 NOTE — Progress Notes (Signed)
[facility] POST-OP OFFICE VISIT  07/20/2022  HPI: Anthony Hunter is a 57 y.o. male 11 days s/p excision of 2 cm perineal cyst with Dr Aleen Campi   He is doing very well He has had no issues with pain; some increase in sensitivity in the last 2 days but no pain No fever, chills Incision is healing well; no erythema or drainage   Vital signs: BP (!) 146/96   Pulse 60   Temp 98 F (36.7 C)   Ht 6\' 2"  (1.88 m)   Wt 266 lb (120.7 kg)   SpO2 97%   BMI 34.15 kg/m    Physical Exam: Constitutional: Well appearing male, NAD Skin: ~2 cm wound to the left perineum, this is completed healed. There is small amount of induration consistent with scar tissue, no underlying fluid collection, no erythema, no drainage   Assessment/Plan: [name] 11 days s/p excision of 2 cm perineal cyst with Dr Aleen Campi    - Pain control prn  - Reviewed wound care recommendation  - Reviewed surgical pathology; perineal cyst  - He can follow up on as needed basis; He understands to call with questions/concerns  -- Lynden Oxford, [name] [facility] 07/20/2022, 3:08 PM M-F: 7am - 4pm

## 2022-08-06 ENCOUNTER — Other Ambulatory Visit: Payer: Self-pay | Admitting: Family Medicine

## 2022-08-06 NOTE — Telephone Encounter (Signed)
Requested medications are due for refill today.  no  Requested medications are on the active medications list.  yes  Last refill. 07/13/2022 #30 3 rf  Future visit scheduled.   yes  Notes to clinic.  Pt is requesting a 90 day supply.    Requested Prescriptions  Pending Prescriptions Disp Refills   traZODone (DESYREL) 50 MG tablet [Pharmacy Med Name: TRAZODONE 50 MG TABLET] 90 tablet 2    Sig: TAKE 0.5-1 TABLETS BY MOUTH AT BEDTIME AS NEEDED FOR SLEEP.     Psychiatry: Antidepressants - Serotonin Modulator Passed - 08/06/2022  1:33 PM      Passed - Completed PHQ-2 or PHQ-9 in the last 360 days      Passed - Valid encounter within last 6 months    Recent Outpatient Visits           3 weeks ago Routine general medical examination at a health care facility   [facility], [name], DO   2 months ago Moderate persistent asthma without complication   [facility] [place], [name], DO   3 months ago Moderate persistent asthma without complication   [facility] [place], [name], NP   7 months ago Primary hypertension   [facility] [place], [name], DO   1 year ago Routine general medical examination at a health care facility   [facility] Dorcas Carrow, DO       Future Appointments             In 5 months [name], [name], DO [name]

## 2022-08-20 ENCOUNTER — Encounter: Payer: Self-pay | Admitting: Emergency Medicine

## 2022-08-20 ENCOUNTER — Ambulatory Visit
Admission: EM | Admit: 2022-08-20 | Discharge: 2022-08-20 | Disposition: A | Discharge status: Home / Self Care | Payer: 59 | Attending: Urgent Care | Admitting: Urgent Care

## 2022-08-20 DIAGNOSIS — S161XXA Strain of muscle, fascia and tendon at neck level, initial encounter: Secondary | ICD-10-CM

## 2022-08-20 MED ORDER — PREDNISONE 10 MG PO TABS
ORAL_TABLET | ORAL | 0 refills | Status: AC
Start: 2022-08-20 — End: 2022-08-29

## 2022-08-20 MED ORDER — CYCLOBENZAPRINE HCL 10 MG PO TABS
10.0000 mg | ORAL_TABLET | Freq: Two times a day (BID) | ORAL | 0 refills | Status: DC | PRN
Start: 2022-08-20 — End: 2023-01-17

## 2022-08-20 NOTE — Discharge Instructions (Signed)
Follow up with your primary care provider if your symptoms are worsening or not improving.    

## 2022-08-20 NOTE — ED Triage Notes (Signed)
Received the shingles vaccine 6 weeks ago. Woke up next day in severe neck/back pain, unable to move body. Patient has tried to wait it out, hasn't improved. Reports continued stiffness in affected areas. Tried getting a massage that helped. Requesting a steroid shot that he found may help online. Tried making an appointment at his PCP office and has been unable to get in.
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
## 2022-08-23 ENCOUNTER — Ambulatory Visit (INDEPENDENT_AMBULATORY_CARE_PROVIDER_SITE_OTHER): Payer: 59 | Admitting: Physician Assistant

## 2022-08-23 VITALS — BP 132/88 | HR 63 | Temp 97.4°F | Ht 74.0 in | Wt 267.4 lb

## 2022-08-23 DIAGNOSIS — G4700 Insomnia, unspecified: Secondary | ICD-10-CM | POA: Diagnosis not present

## 2022-08-23 MED ORDER — RAMELTEON 8 MG PO TABS
8.0000 mg | ORAL_TABLET | Freq: Every day | ORAL | 1 refills | Status: DC
Start: 2022-08-23 — End: 2023-01-17

## 2022-08-23 NOTE — Progress Notes (Signed)
Acute Office Visit   Patient: Anthony Hunter   DOB: 1965/04/21   57 y.o. Male  MRN: [id number] Visit Date: 08/23/2022  Today's healthcare provider: [name] Ranika Mcniel, [name]  Introduced myself to the patient as a Secondary school teacher and provided education on APPs in clinical practice.    Chief Complaint  Patient presents with   Sleeping Problem    Patient says he is having issues with falling asleep and was prescribed Trazodone and says he is no longer taking the medication as he noticed some elevation in his heart rate. Patient says he has tried herbal teas, Zz-Quil and Melatonin. Patient says he does not seem to notice any help with the issues.    Immunizations    Patient says he has an reaction to the Shingles vaccine from 07/13/22. Patient says he is still experiencing pain in his muscles. Patient says he was seen at an Urgent Care Friday evening and he was prescribed Prednisone and Flexeril. Patient says he has been taking the medication for 3 days, not and says that he is feeling a little better. Patient says it effects his muscles all over.    Subjective    HPI HPI     Sleeping Problem    Additional comments: Patient says he is having issues with falling asleep and was prescribed Trazodone and says he is no longer taking the medication as he noticed some elevation in his heart rate. Patient says he has tried herbal teas, Zz-Quil and Melatonin. Patient says he does not seem to notice any help with the issues.         Immunizations    Additional comments: Patient says he has an reaction to the Shingles vaccine from 07/13/22. Patient says he is still experiencing pain in his muscles. Patient says he was seen at an Urgent Care Friday evening and he was prescribed Prednisone and Flexeril. Patient says he has been taking the medication for 3 days, not and says that he is feeling a little better. Patient says it effects his muscles all over.       Last edited by Malen Gauze, CMA on 08/23/2022  2:45  PM.           Insomnia  He reports trying the Trazodone for 3 nights but was concerned due to elevated BP and heart rate Reports checking his BP one night after taking it and it was 150s/105  He reports he has not been able to exercise much since Oct last year and reports he thinks this is likely contributing to it  He reports sleep maintenance issues and sleep initiation issues  Interventions: Has tried Melatonin, trazodone, herbal teas, Z quil  He states the Zquil has been the most effective so far  He has plans to start exercising again and hopes this will improve his sleep quality  He reports he has been playing chess on his tablet or watching TV before bed - he has tried to use Blue light glasses to block it  He states he does not like to read so this wouldn't be an option  Around 4 pm he reports feeling very sleepy but will nap only until 7 pm - states he tries to refrain from napping as this will impact his nighttime sleep and takes away family time   Concern after shingles vaccination  He states he got his first Shingles shot and is having issues with muscles aches and pain  He reports neck  stiffness and prolonged muscle pain since getting the vaccine and states he does not want to the second shot  He is amenable to shingles vaccine being added to allergy list to prevent further admin    Medications: Outpatient Medications Prior to Visit  Medication Sig   albuterol (VENTOLIN HFA) 108 (90 Base) MCG/ACT inhaler TAKE 2 PUFFS BY MOUTH EVERY 6 HOURS AS NEEDED FOR WHEEZE OR SHORTNESS OF BREATH   amLODipine (NORVASC) 10 MG tablet Take 1 tablet (10 mg total) by mouth daily.   benazepril (LOTENSIN) 40 MG tablet Take 1 tablet (40 mg total) by mouth daily.   cyclobenzaprine (FLEXERIL) 10 MG tablet Take 1 tablet (10 mg total) by mouth 2 (two) times daily as needed for muscle spasms.   EPINEPHrine (EPIPEN 2-PAK) 0.3 mg/0.3 mL IJ SOAJ injection Inject 0.3 mg into the muscle as needed  for anaphylaxis.   MAGNESIUM PO Take 400 mg by mouth daily.   Multiple Vitamins-Minerals (MULTIVITAMIN GUMMIES ADULT PO) Take 2 each by mouth daily.   Multiple Vitamins-Minerals (ZINC PO) Take 1 tablet by mouth daily.   Omega-3 Fatty Acids (FISH OIL) 1000 MG CAPS Take 1 capsule by mouth.   omeprazole (PRILOSEC) 20 MG capsule Take 1 capsule (20 mg total) by mouth daily.   PARoxetine (PAXIL) 20 MG tablet Take 1 tablet (20 mg total) by mouth daily.   predniSONE (DELTASONE) 10 MG tablet Take 6 tablets (60 mg total) by mouth daily with breakfast for 3 days, THEN 4 tablets (40 mg total) daily with breakfast for 3 days, THEN 2 tablets (20 mg total) daily with breakfast for 3 days.   VITAMIN D PO Take 10,000 Units by mouth daily.   [DISCONTINUED] traZODone (DESYREL) 50 MG tablet TAKE 0.5-1 TABLETS BY MOUTH AT BEDTIME AS NEEDED FOR SLEEP.   No facility-administered medications prior to visit.    Review of Systems  Psychiatric/Behavioral:  Positive for sleep disturbance.        Objective    BP 132/88   Pulse 63   Temp (!) 97.4 F (36.3 C) (Oral)   Ht 6\' 2"  (1.88 m)   Wt 267 lb 6.4 oz (121.3 kg)   SpO2 97%   BMI 34.33 kg/m    Physical Exam Vitals reviewed.  Constitutional:      General: He is awake.     Appearance: Normal appearance. He is well-developed and well-groomed.  HENT:     Head: Normocephalic and atraumatic.  Pulmonary:     Effort: Pulmonary effort is normal.  Musculoskeletal:     Cervical back: Normal range of motion.  Neurological:     General: No focal deficit present.     Mental Status: He is alert and oriented to person, place, and time.     GCS: GCS eye subscore is 4. GCS verbal subscore is 5. GCS motor subscore is 6.     Cranial Nerves: No dysarthria or facial asymmetry.     Motor: No weakness or tremor.     Gait: Gait is intact.  Psychiatric:        Attention and Perception: Attention and perception normal.        Mood and Affect: Mood and affect normal.         Speech: Speech normal.        Behavior: Behavior normal. Behavior is cooperative.       No results found for any visits on 08/23/22.  Assessment & Plan      Return in about 6  weeks (around 10/04/2022) for Insomnia.     Problem List Items Addressed This Visit       Other   Insomnia - Primary    Chronic per patient, ongoing and exacerbated He reports trying Trazodone from previous visit but this caused side effects that made him uncomfortable Recommend DC Trazodone at this time Will try Ramelteon 8 mg PO at bedtime to assist with sleep concerns Reviewed that insurance coverage may limit options but if Ramelteon is not effective we may need to consider a DORA for further control.  We discussed sleep hygiene and information was provided on this to aid with sleep improvement Recommend follow up in 6 weeks to discuss response to medication changes.        Relevant Medications   ramelteon (ROZEREM) 8 MG tablet     Return in about 6 weeks (around 10/04/2022) for Insomnia.   I, Wister Hoefle E Ramatoulaye Pack, [name], have reviewed all documentation for this visit. The documentation on 08/23/22 for the exam, diagnosis, procedures, and orders are all accurate and complete.   Jacquelin Hawking, MHS, [name] [facility]

## 2022-08-23 NOTE — Patient Instructions (Signed)
Sleep hygiene (Practices to help improve sleep)  *Try to go to bed at the same time every night *Make your room as dark and quiet as possible for sleep (ambient noises or sleep machine is okay too) *Try to establish a bedtime routine before bed (shower, reading for a little bit, etc.) *No TVs or phone for at least 30min to an hour before bed. (The blue light from these devices can affect your sleep-wake cycle and make it harder to go to sleep) *Avoid caffeine in the afternoon and especially before bedtime.  The goal is to establish a relaxing environment and routine that signals your body that it is time to go to sleep.    

## 2022-08-23 NOTE — Assessment & Plan Note (Signed)
Chronic per patient, ongoing and exacerbated He reports trying Trazodone from previous visit but this caused side effects that made him uncomfortable Recommend DC Trazodone at this time Will try Ramelteon 8 mg PO at bedtime to assist with sleep concerns Reviewed that insurance coverage may limit options but if Ramelteon is not effective we may need to consider a DORA for further control.  We discussed sleep hygiene and information was provided on this to aid with sleep improvement Recommend follow up in 6 weeks to discuss response to medication changes.

## 2022-08-30 ENCOUNTER — Other Ambulatory Visit: Payer: Self-pay | Admitting: Physician Assistant

## 2022-08-30 DIAGNOSIS — G4700 Insomnia, unspecified: Secondary | ICD-10-CM

## 2022-08-31 NOTE — Telephone Encounter (Signed)
Requested medication (s) are due for refill today: no  Requested medication (s) are on the active medication list: yes  Last refill:  08/23/22 #30 1 refill  Future visit scheduled: yes in 1 month  Notes to clinic:  last refill 08/23/22 by E. [name], PA. Pharmacy comment: REQUEST FOR 90 DAYS PRESCRIPTION. DX Code Needed.  Do you want to refill for 90 day?     Requested Prescriptions  Pending Prescriptions Disp Refills   ramelteon (ROZEREM) 8 MG tablet [Pharmacy Med Name: RAMELTEON 8 MG TABLET] 90 tablet 1    Sig: TAKE 1 TABLET BY MOUTH AT BEDTIME.     Psychiatry: Anxiolytics/Hypnotics - Non-controlled Passed - 08/30/2022 10:30 AM      Passed - Valid encounter within last 12 months    Recent Outpatient Visits           1 week ago Insomnia, unspecified type   [name] [name], [name], [name]   1 month ago Routine general medical examination at a health care facility   [name], [name], DO   2 months ago Moderate persistent asthma without complication   [name], [name], DO   4 months ago Moderate persistent asthma without complication   [name], [name], NP   8 months ago Primary hypertension   [name], [name], DO       Future Appointments             In 1 month [name], [name], DO [name]   In 4 months [name], [name], DO  [name]

## 2022-10-04 ENCOUNTER — Ambulatory Visit: Payer: 59 | Admitting: Family Medicine

## 2022-10-26 ENCOUNTER — Encounter: Payer: Self-pay | Admitting: Gastroenterology

## 2022-10-27 ENCOUNTER — Ambulatory Visit: Payer: 59

## 2022-10-27 ENCOUNTER — Encounter
Admission: RE | Disposition: A | Discharge status: Home / Self Care | Payer: Self-pay | Source: Home / Self Care | Attending: Gastroenterology

## 2022-10-27 ENCOUNTER — Other Ambulatory Visit: Payer: Self-pay | Admitting: Gastroenterology

## 2022-10-27 ENCOUNTER — Ambulatory Visit
Admission: RE | Admit: 2022-10-27 | Discharge: 2022-10-27 | Disposition: A | Discharge status: Home / Self Care | Payer: 59 | Attending: Gastroenterology | Admitting: Gastroenterology

## 2022-10-27 DIAGNOSIS — I10 Essential (primary) hypertension: Secondary | ICD-10-CM | POA: Insufficient documentation

## 2022-10-27 DIAGNOSIS — D124 Benign neoplasm of descending colon: Secondary | ICD-10-CM | POA: Insufficient documentation

## 2022-10-27 DIAGNOSIS — Z1211 Encounter for screening for malignant neoplasm of colon: Secondary | ICD-10-CM | POA: Diagnosis present

## 2022-10-27 DIAGNOSIS — K635 Polyp of colon: Secondary | ICD-10-CM | POA: Diagnosis not present

## 2022-10-27 DIAGNOSIS — F32A Depression, unspecified: Secondary | ICD-10-CM | POA: Insufficient documentation

## 2022-10-27 DIAGNOSIS — Z79899 Other long term (current) drug therapy: Secondary | ICD-10-CM | POA: Insufficient documentation

## 2022-10-27 DIAGNOSIS — K219 Gastro-esophageal reflux disease without esophagitis: Secondary | ICD-10-CM | POA: Insufficient documentation

## 2022-10-27 DIAGNOSIS — Z86010 Personal history of colonic polyps: Secondary | ICD-10-CM

## 2022-10-27 DIAGNOSIS — J45909 Unspecified asthma, uncomplicated: Secondary | ICD-10-CM | POA: Insufficient documentation

## 2022-10-27 DIAGNOSIS — D126 Benign neoplasm of colon, unspecified: Secondary | ICD-10-CM

## 2022-10-27 HISTORY — PX: COLONOSCOPY WITH PROPOFOL: SHX5780

## 2022-10-27 HISTORY — PX: POLYPECTOMY: SHX5525

## 2022-10-27 SURGERY — COLONOSCOPY WITH PROPOFOL
Anesthesia: General

## 2022-10-27 MED ORDER — PROPOFOL 500 MG/50ML IV EMUL
INTRAVENOUS | Status: DC | PRN
  Administered 2022-10-27: 150 ug/kg/min via INTRAVENOUS

## 2022-10-27 MED ORDER — LIDOCAINE HCL (CARDIAC) PF 100 MG/5ML IV SOSY
PREFILLED_SYRINGE | INTRAVENOUS | Status: DC | PRN
  Administered 2022-10-27: 50 mg via INTRAVENOUS

## 2022-10-27 MED ORDER — PROPOFOL 1000 MG/100ML IV EMUL
INTRAVENOUS | Status: AC
  Filled 2022-10-27: qty 100

## 2022-10-27 MED ORDER — SODIUM CHLORIDE 0.9 % IV SOLN
INTRAVENOUS | Status: DC
  Administered 2022-10-27: 20 mL/h via INTRAVENOUS

## 2022-10-27 MED ORDER — PROPOFOL 1000 MG/100ML IV EMUL
INTRAVENOUS | Status: AC
  Filled 2022-10-27: qty 200

## 2022-10-27 MED ORDER — PROPOFOL 10 MG/ML IV BOLUS
INTRAVENOUS | Status: DC | PRN
  Administered 2022-10-27: 20 mg via INTRAVENOUS
  Administered 2022-10-27: 30 mg via INTRAVENOUS
  Administered 2022-10-27: 70 mg via INTRAVENOUS

## 2022-10-27 NOTE — Transfer of Care (Signed)
Immediate Anesthesia Transfer of Care Note  Patient: Anthony Hunter  Procedure(s) Performed: COLONOSCOPY WITH PROPOFOL POLYPECTOMY  Patient Location: Endoscopy Unit  Anesthesia Type:MAC  Level of Consciousness: awake and patient cooperative  Airway & Oxygen Therapy: Patient Spontanous Breathing and Patient connected to face mask oxygen  Post-op Assessment: Report given to RN and Post -op Vital signs reviewed and stable  Post vital signs: Reviewed and stable  Last Vitals:  Vitals Value Taken Time  BP    Temp    Pulse 68 10/27/22 0812  Resp 22 10/27/22 0812  SpO2 100 % 10/27/22 0812  Vitals shown include unfiled device data.  Last Pain:  Vitals:   10/27/22 0640  TempSrc: Temporal  PainSc: 0-No pain         Complications: No notable events documented.

## 2022-10-27 NOTE — Op Note (Signed)
[facility] Patient Name: Anthony Hunter Procedure Date: 10/27/2022 7:36 AM MRN: [id number] Account #: [id number] Date of Birth: 06/26/1965 Admit Type: Outpatient Age: 57 Room: [facility] ENDO ROOM 3 Gender: Male Note Status: Finalized Instrument Name: Nelda Marseille [id number] Procedure:             Colonoscopy Indications:           Surveillance: Personal history of adenomatous polyps                         on last colonoscopy 5 years ago Providers:             Wyline Mood MD, MD Referring MD:          Dorcas Carrow (Referring MD) Medicines:             Monitored Anesthesia Care Complications:         No immediate complications. Procedure:             Pre-Anesthesia Assessment:                        - Prior to the procedure, a History and Physical was                         performed, and patient medications, allergies and                         sensitivities were reviewed. The patient's tolerance                         of previous anesthesia was reviewed.                        - The risks and benefits of the procedure and the                         sedation options and risks were discussed with the                         patient. All questions were answered and informed                         consent was obtained.                        - ASA Grade Assessment: II - A patient with mild                         systemic disease.                        After obtaining informed consent, the colonoscope was                         passed under direct vision. Throughout the procedure,                         the patient's blood pressure, pulse, and oxygen                         saturations  were monitored continuously. The                         Colonoscope was introduced through the anus and                         advanced to the the cecum, identified by the                         appendiceal orifice. The colonoscopy was performed                          with ease. The patient tolerated the procedure well.                         The quality of the bowel preparation was adequate. The                         ileocecal valve, appendiceal orifice, and rectum were                         photographed. Findings:      The perianal and digital rectal examinations were normal.      A 6 mm polyp was found in the descending colon. The polyp was sessile.       The polyp was removed with a cold snare. Resection and retrieval were       complete.      A 5 mm polyp was found in the cecum. The polyp was sessile. The polyp       was removed with a cold snare. Resection was complete, but the polyp       tissue was not retrieved.      The exam was otherwise without abnormality on direct and retroflexion       views. Impression:            - One 6 mm polyp in the descending colon, removed with                         a cold snare. Resected and retrieved.                        - One 5 mm polyp in the cecum, removed with a cold                         snare. Complete resection. Polyp tissue not retrieved.                        - The examination was otherwise normal on direct and                         retroflexion views. Recommendation:        - Discharge patient to home (with escort).                        - Resume previous diet.                        - Continue present medications.                        -  Await pathology results.                        - Repeat colonoscopy in 7 years for surveillance. Procedure Code(s):     --- Professional ---                        [phone], Colonoscopy, flexible; with removal of                         tumor(s), polyp(s), or other lesion(s) by snare                         technique Diagnosis Code(s):     --- Professional ---                        Z86.010, Personal history of colonic polyps                        D12.4, Benign neoplasm of descending colon                        D12.0, Benign neoplasm of  cecum CPT copyright 2022 American Medical Association. All rights reserved. The codes documented in this report are preliminary and upon coder review may  be revised to meet current compliance requirements. Wyline Mood, MD Wyline Mood MD, MD 10/27/2022 8:09:15 AM This report has been signed electronically. Number of Addenda: 0 Note Initiated On: 10/27/2022 7:36 AM Scope Withdrawal Time: 0 hours 12 minutes 4 seconds  Total Procedure Duration: 0 hours 16 minutes 27 seconds  Estimated Blood Loss:  Estimated blood loss: none.      [facility]
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
## 2022-10-27 NOTE — Anesthesia Procedure Notes (Signed)
Procedure Name: General with mask airway Date/Time: 10/27/2022 7:45 AM  Performed by: Lily Lovings, CRNAPre-anesthesia Checklist: Patient identified, Emergency Drugs available, Suction available, Patient being monitored and Timeout performed Patient Re-evaluated:Patient Re-evaluated prior to induction Oxygen Delivery Method: Simple face mask Preoxygenation: Pre-oxygenation with 100% oxygen Induction Type: IV induction

## 2022-10-27 NOTE — Anesthesia Postprocedure Evaluation (Signed)
Anesthesia Post Note  Patient: Anthony Hunter  Procedure(s) Performed: COLONOSCOPY WITH PROPOFOL POLYPECTOMY  Patient location during evaluation: Endoscopy Anesthesia Type: General Level of consciousness: awake and alert Pain management: pain level controlled Vital Signs Assessment: post-procedure vital signs reviewed and stable Respiratory status: spontaneous breathing, nonlabored ventilation, respiratory function stable and patient connected to nasal cannula oxygen Cardiovascular status: blood pressure returned to baseline and stable Postop Assessment: no apparent nausea or vomiting Anesthetic complications: no   No notable events documented.   Last Vitals:  Vitals:   10/27/22 0830 10/27/22 0833  BP:  (!) 126/90  Pulse: (!) 51 (!) 52  Resp: 15 17  Temp:    SpO2: 94% 97%    Last Pain:  Vitals:   10/27/22 0814  TempSrc: Temporal  PainSc:                  Louie Boston
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
## 2022-10-28 ENCOUNTER — Encounter: Payer: Self-pay | Admitting: Gastroenterology

## 2022-11-01 ENCOUNTER — Encounter: Payer: Self-pay | Admitting: Gastroenterology

## 2022-11-27 ENCOUNTER — Ambulatory Visit
Admission: EM | Admit: 2022-11-27 | Discharge: 2022-11-27 | Disposition: A | Discharge status: Home / Self Care | Payer: 59 | Attending: Emergency Medicine | Admitting: Emergency Medicine

## 2022-11-27 DIAGNOSIS — J069 Acute upper respiratory infection, unspecified: Secondary | ICD-10-CM

## 2022-11-27 MED ORDER — PREDNISONE 20 MG PO TABS: 40.0000 mg | ORAL_TABLET | Freq: Every day | ORAL | 0 refills | Status: DC

## 2022-11-27 MED ORDER — AMOXICILLIN-POT CLAVULANATE 875-125 MG PO TABS: 1.0000 | ORAL_TABLET | Freq: Two times a day (BID) | ORAL | 0 refills | Status: AC

## 2022-11-27 MED ORDER — BENZONATATE 100 MG PO CAPS: 100.0000 mg | ORAL_CAPSULE | Freq: Three times a day (TID) | ORAL | 0 refills | Status: DC
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
## 2022-11-27 NOTE — Discharge Instructions (Addendum)
Begin Augmentin take every morning and every evening for 10 days to provide coverage for the upper airway and sinuses  Begin prednisone every morning with food for 5 days to open and relax airway should settle shortness of breath  May use Tessalon pill every 8 hours as needed to help calm coughing, may continue this in addition to over-the-counter Delsym and Mucinex  May use your albuterol inhaler as needed, as directed to further assist with shortness of breath    You can take Tylenol and/or Ibuprofen as needed for fever reduction and pain relief.   For cough: honey 1/2 to 1 teaspoon (you can dilute the honey in water or another fluid).  You can also use guaifenesin and dextromethorphan for cough. You can use a humidifier for chest congestion and cough.  If you don't have a humidifier, you can sit in the bathroom with the hot shower running.      For sore throat: try warm salt water gargles, cepacol lozenges, throat spray, warm tea or water with lemon/honey, popsicles or ice, or OTC cold relief medicine for throat discomfort.   For congestion: take a daily anti-histamine like Zyrtec, Claritin, and a oral decongestant, such as pseudoephedrine.  You can also use Flonase 1-2 sprays in each nostril daily.   It is important to stay hydrated: drink plenty of fluids (water, gatorade/powerade/pedialyte, juices, or teas) to keep your throat moisturized and help further relieve irritation/discomfort.

## 2022-11-27 NOTE — ED Triage Notes (Signed)
Patient presents to UC for productive cough, intermittent chills, sneezing, runny nose x 1 week. SOB since dx of COVID last year but worse over the past week. Treating symptoms with OTC cough meds.    Denies fever.

## 2022-12-15 ENCOUNTER — Encounter: Payer: Self-pay | Admitting: Family Medicine

## 2022-12-15 ENCOUNTER — Ambulatory Visit (INDEPENDENT_AMBULATORY_CARE_PROVIDER_SITE_OTHER): Payer: 59 | Admitting: Family Medicine

## 2022-12-15 VITALS — BP 129/92 | HR 71 | Wt 267.2 lb

## 2022-12-15 DIAGNOSIS — R059 Cough, unspecified: Secondary | ICD-10-CM | POA: Diagnosis not present

## 2022-12-15 MED ORDER — PREDNISONE 10 MG PO TABS: ORAL_TABLET | ORAL | 0 refills | Status: DC

## 2022-12-15 MED ORDER — AIRSUPRA 90-80 MCG/ACT IN AERO: 2.0000 | INHALATION_SPRAY | Freq: Four times a day (QID) | RESPIRATORY_TRACT | 3 refills | Status: DC

## 2022-12-15 MED ORDER — FLUTICASONE FUROATE-VILANTEROL 100-25 MCG/ACT IN AEPB: 1.0000 | INHALATION_SPRAY | Freq: Every day | RESPIRATORY_TRACT | 2 refills | Status: DC

## 2022-12-15 NOTE — Progress Notes (Signed)
BP (!) 129/92   Pulse 71   Wt 267 lb 3.2 oz (121.2 kg)   SpO2 98%   BMI 34.31 kg/m    Subjective:    Patient ID: Anthony Hunter, male    DOB: 1966-02-11, 57 y.o.   MRN: [id number]  HPI: Anthony Hunter is a 57 y.o. male  Chief Complaint  Patient presents with   Cough   Wheezing    Patient says for the past year, he has been having symptoms to come off and on since he was diagnosed with COVID since last October. Patient says every 1-2 months, he is having the same symptoms and has been to local urgent care and always treated antibiotic and Prednisone. Then after completing course of treatment, he is back sick again. Patient says he has upcoming trip to [place] and wants to discuss possible Long Covid as he was told by a provider at Urgent Care.    Sneezing   UPPER RESPIRATORY TRACT INFECTION Duration: 3 days, had been sick about 2 weeks ago  Worst symptom: cough Fever: no Cough: yes Shortness of breath: yes- a bit worse Wheezing: yes Chest pain: no Chest tightness: yes Chest congestion: yes Nasal congestion: no Runny nose: yes Post nasal drip: yes Sneezing: yes Sore throat: no Swollen glands: no Sinus pressure: no Headache: no Face pain: no Toothache: no Ear pain: no  Ear pressure: no  Eyes red/itching:yes Eye drainage/crusting: yes  Vomiting: no Rash: no Fatigue: yes Sick contacts: yes Strep contacts: no  Context: worse Recurrent sinusitis: no Relief with OTC cold/cough medications: no  Treatments attempted: mucinex, anti-histamine, pseudoephedrine, cough syrup, and antibiotics    Relevant past medical, surgical, family and social history reviewed and updated as indicated. Interim medical history since our last visit reviewed. Allergies and medications reviewed and updated.  Review of Systems  Constitutional: Negative.   HENT:  Positive for congestion, postnasal drip, rhinorrhea and sneezing. Negative for dental problem, drooling, ear discharge, ear pain, facial  swelling, hearing loss, mouth sores, nosebleeds, sinus pressure, sinus pain, sore throat, tinnitus, trouble swallowing and voice change.   Eyes: Negative.   Respiratory:  Positive for cough, chest tightness, shortness of breath and wheezing. Negative for apnea, choking and stridor.   Cardiovascular: Negative.   Gastrointestinal: Negative.   Musculoskeletal: Negative.   Psychiatric/Behavioral: Negative.      Per HPI unless specifically indicated above     Objective:    BP (!) 129/92   Pulse 71   Wt 267 lb 3.2 oz (121.2 kg)   SpO2 98%   BMI 34.31 kg/m   Wt Readings from Last 3 Encounters:  12/15/22 267 lb 3.2 oz (121.2 kg)  10/27/22 257 lb 9.6 oz (116.8 kg)  08/23/22 267 lb 6.4 oz (121.3 kg)    Physical Exam Vitals and nursing note reviewed.  Constitutional:      General: He is not in acute distress.    Appearance: Normal appearance. He is not ill-appearing, toxic-appearing or diaphoretic.  HENT:     Head: Normocephalic and atraumatic.     Right Ear: Tympanic membrane, ear canal and external ear normal. There is no impacted cerumen.     Left Ear: Tympanic membrane, ear canal and external ear normal. There is no impacted cerumen.     Nose: Congestion and rhinorrhea present.     Mouth/Throat:     Mouth: Mucous membranes are moist.     Pharynx: Oropharynx is clear. No oropharyngeal exudate or posterior oropharyngeal erythema.  Eyes:  General: No scleral icterus.       Right eye: No discharge.        Left eye: No discharge.     Extraocular Movements: Extraocular movements intact.     Conjunctiva/sclera: Conjunctivae normal.     Pupils: Pupils are equal, round, and reactive to light.  Cardiovascular:     Rate and Rhythm: Normal rate and regular rhythm.     Pulses: Normal pulses.     Heart sounds: Normal heart sounds. No murmur heard.    No friction rub. No gallop.  Pulmonary:     Effort: Pulmonary effort is normal. No respiratory distress.     Breath sounds: No  stridor. Wheezing present. No rhonchi or rales.  Chest:     Chest wall: No tenderness.  Musculoskeletal:        General: Normal range of motion.     Cervical back: Normal range of motion and neck supple.  Skin:    General: Skin is warm and dry.     Capillary Refill: Capillary refill takes less than 2 seconds.     Coloration: Skin is not jaundiced or pale.     Findings: No bruising, erythema, lesion or rash.  Neurological:     General: No focal deficit present.     Mental Status: He is alert and oriented to person, place, and time. Mental status is at baseline.  Psychiatric:        Mood and Affect: Mood normal.        Behavior: Behavior normal.        Thought Content: Thought content normal.        Judgment: Judgment normal.     Results for orders placed or performed in visit on 07/13/22  Comprehensive metabolic panel  Result Value Ref Range   Glucose 90 70 - 99 mg/dL   BUN 17 6 - 24 mg/dL   Creatinine, Ser 1.61 0.76 - 1.27 mg/dL   eGFR 84 >09 UE/AVW/0.98   BUN/Creatinine Ratio 16 9 - 20   Sodium 138 134 - 144 mmol/L   Potassium 3.9 3.5 - 5.2 mmol/L   Chloride 101 96 - 106 mmol/L   CO2 21 20 - 29 mmol/L   Calcium 9.3 8.7 - 10.2 mg/dL   Total Protein 6.6 6.0 - 8.5 g/dL   Albumin 4.4 3.8 - 4.9 g/dL   Globulin, Total 2.2 1.5 - 4.5 g/dL   Albumin/Globulin Ratio 2.0 1.2 - 2.2   Bilirubin Total 0.4 0.0 - 1.2 mg/dL   Alkaline Phosphatase 98 44 - 121 IU/L   AST 20 0 - 40 IU/L   ALT 26 0 - 44 IU/L  CBC with Differential/Platelet  Result Value Ref Range   WBC 5.6 3.4 - 10.8 x10E3/uL   RBC 4.51 4.14 - 5.80 x10E6/uL   Hemoglobin 14.2 13.0 - 17.7 g/dL   Hematocrit 11.9 14.7 - 51.0 %   MCV 92 79 - 97 fL   MCH 31.5 26.6 - 33.0 pg   MCHC 34.1 31.5 - 35.7 g/dL   RDW 82.9 56.2 - 13.0 %   Platelets 231 150 - 450 x10E3/uL   Neutrophils 54 Not Estab. %   Lymphs 34 Not Estab. %   Monocytes 8 Not Estab. %   Eos 3 Not Estab. %   Basos 1 Not Estab. %   Neutrophils Absolute 3.0 1.4 -  7.0 x10E3/uL   Lymphocytes Absolute 1.9 0.7 - 3.1 x10E3/uL   Monocytes Absolute 0.5 0.1 - 0.9 x10E3/uL  EOS (ABSOLUTE) 0.2 0.0 - 0.4 x10E3/uL   Basophils Absolute 0.0 0.0 - 0.2 x10E3/uL   Immature Granulocytes 0 Not Estab. %   Immature Grans (Abs) 0.0 0.0 - 0.1 x10E3/uL  Lipid Panel w/o Chol/HDL Ratio  Result Value Ref Range   Cholesterol, Total 258 (H) 100 - 199 mg/dL   Triglycerides 161 (H) 0 - 149 mg/dL   HDL 41 >09 mg/dL   VLDL Cholesterol Cal 83 (H) 5 - 40 mg/dL   LDL Chol Calc (NIH) 604 (H) 0 - 99 mg/dL  PSA  Result Value Ref Range   Prostate Specific Ag, Serum 1.5 0.0 - 4.0 ng/mL  TSH  Result Value Ref Range   TSH 4.380 0.450 - 4.500 uIU/mL  Urinalysis, Routine w reflex microscopic  Result Value Ref Range   Specific Gravity, UA 1.010 1.005 - 1.030   pH, UA 7.0 5.0 - 7.5   Color, UA Yellow Yellow   Appearance Ur Clear Clear   Leukocytes,UA Negative Negative   Protein,UA Negative Negative/Trace   Glucose, UA Negative Negative   Ketones, UA Negative Negative   RBC, UA Negative Negative   Bilirubin, UA Negative Negative   Urobilinogen, Ur 0.2 0.2 - 1.0 mg/dL   Nitrite, UA Negative Negative   Microscopic Examination Comment   Microalbumin, Urine Waived  Result Value Ref Range   Microalb, Ur Waived 10 0 - 19 mg/L   Creatinine, Urine Waived 10 10 - 300 mg/dL   Microalb/Creat Ratio 30-300 (H) <30 mg/g      Assessment & Plan:   Problem Hunter Items Addressed This Visit   None Visit Diagnoses     Cough, unspecified type    -  Primary   Will treat with prednisone. Start breo and [place]. Call if not getting better or getting worse. Flu and strep negative. Await COVID   Relevant Orders   Rapid Strep Screen ([facility] ONLY)   Veritor Flu A/B Waived   Novel Coronavirus, NAA ([facility])        Follow up plan: Return as scheduled.

## 2022-12-17 ENCOUNTER — Other Ambulatory Visit: Payer: Self-pay | Admitting: Family Medicine

## 2022-12-17 LAB — NOVEL CORONAVIRUS, NAA: SARS-CoV-2, NAA: NOT DETECTED

## 2022-12-17 NOTE — Telephone Encounter (Signed)
Rx 07/13/22 #90 1RF- too soon Requested Prescriptions  Pending Prescriptions Disp Refills   benazepril (LOTENSIN) 40 MG tablet [Pharmacy Med Name: BENAZEPRIL HCL 40 MG TABLET] 90 tablet 1    Sig: TAKE 1 TABLET BY MOUTH EVERY DAY     Cardiovascular:  ACE Inhibitors Failed - 12/17/2022  1:30 AM      Failed - Last BP in normal range    BP Readings from Last 1 Encounters:  12/15/22 (!) 129/92         Passed - Cr in normal range and within 180 days    Creatinine  Date Value Ref Range Status  04/12/2013 0.94 0.60 - 1.30 mg/dL Final   Creatinine, Ser  Date Value Ref Range Status  07/13/2022 1.05 0.76 - 1.27 mg/dL Final         Passed - K in normal range and within 180 days    Potassium  Date Value Ref Range Status  07/13/2022 3.9 3.5 - 5.2 mmol/L Final  04/12/2013 4.7 3.5 - 5.1 mmol/L Final         Passed - Patient is not pregnant      Passed - Valid encounter within last 6 months    Recent Outpatient Visits           2 days ago Cough, unspecified type   [place] [facility] [place], [name], DO   3 months ago Insomnia, unspecified type   [place] [facility] [name], [name], [name]   5 months ago Routine general medical examination at a health care facility   [facility] [name], DO   6 months ago Moderate persistent asthma without complication   [place] [facility] [place], [name], DO   8 months ago Moderate persistent asthma without complication   [place] [facility] Marjie Skiff, NP       Future Appointments             In 1 month Johnson, [name], DO [place] [name]

## 2022-12-18 LAB — VERITOR FLU A/B WAIVED
Influenza A: NEGATIVE
Influenza B: NEGATIVE

## 2022-12-18 LAB — CULTURE, GROUP A STREP: Strep A Culture: NEGATIVE

## 2022-12-18 LAB — RAPID STREP SCREEN (MED CTR MEBANE ONLY): Strep Gp A Ag, IA W/Reflex: NEGATIVE

## 2023-01-17 ENCOUNTER — Ambulatory Visit (INDEPENDENT_AMBULATORY_CARE_PROVIDER_SITE_OTHER): Payer: 59 | Admitting: Family Medicine

## 2023-01-17 ENCOUNTER — Telehealth: Payer: Self-pay | Admitting: Family Medicine

## 2023-01-17 ENCOUNTER — Encounter: Payer: Self-pay | Admitting: Family Medicine

## 2023-01-17 VITALS — BP 136/94 | HR 60 | Ht 74.0 in | Wt 275.0 lb

## 2023-01-17 DIAGNOSIS — I10 Essential (primary) hypertension: Secondary | ICD-10-CM | POA: Diagnosis not present

## 2023-01-17 DIAGNOSIS — J4540 Moderate persistent asthma, uncomplicated: Secondary | ICD-10-CM

## 2023-01-17 DIAGNOSIS — F3342 Major depressive disorder, recurrent, in full remission: Secondary | ICD-10-CM

## 2023-01-17 DIAGNOSIS — R0982 Postnasal drip: Secondary | ICD-10-CM | POA: Diagnosis not present

## 2023-01-17 DIAGNOSIS — E782 Mixed hyperlipidemia: Secondary | ICD-10-CM | POA: Diagnosis not present

## 2023-01-17 MED ORDER — FLUTICASONE FUROATE-VILANTEROL 100-25 MCG/ACT IN AEPB: 1.0000 | INHALATION_SPRAY | Freq: Every day | RESPIRATORY_TRACT | 2 refills | Status: DC

## 2023-01-17 MED ORDER — BENAZEPRIL HCL 40 MG PO TABS: 40.0000 mg | ORAL_TABLET | Freq: Every day | ORAL | 1 refills | Status: DC

## 2023-01-17 MED ORDER — PAROXETINE HCL 20 MG PO TABS: 20.0000 mg | ORAL_TABLET | Freq: Every day | ORAL | 1 refills | Status: DC

## 2023-01-17 MED ORDER — EPINEPHRINE 0.3 MG/0.3ML IJ SOAJ: 0.3000 mg | INTRAMUSCULAR | 1 refills | Status: AC | PRN

## 2023-01-17 MED ORDER — OMEPRAZOLE 20 MG PO CPDR: 20.0000 mg | DELAYED_RELEASE_CAPSULE | Freq: Every day | ORAL | 2 refills | Status: DC

## 2023-01-17 MED ORDER — AMLODIPINE BESYLATE 10 MG PO TABS: 10.0000 mg | ORAL_TABLET | Freq: Every day | ORAL | 1 refills | Status: DC

## 2023-01-17 MED ORDER — FLUTICASONE PROPIONATE 50 MCG/ACT NA SUSP: 2.0000 | Freq: Every day | NASAL | 6 refills | Status: DC

## 2023-01-17 NOTE — Assessment & Plan Note (Signed)
Under good control on current regimen. Continue current regimen. Continue to monitor. Call with any concerns. Refills given.   

## 2023-01-17 NOTE — Progress Notes (Signed)
BP (!) 136/94   Pulse 60   Ht 6\' 2"  (1.88 m)   Wt 275 lb (124.7 kg)   SpO2 97%   BMI 35.31 kg/m    Subjective:    Patient ID: Anthony Hunter, male    DOB: 20-Feb-1965, 57 y.o.   MRN: [id number]  HPI: Anthony Hunter is a 57 y.o. male  Chief Complaint  Patient presents with   Hypertension   Asthma   Hyperlipidemia   Depression   Nose Problem    Patient says since last month with the cough, he has noticed some chunky post nasal drip and would like to discuss with provider. Patient says he is using the nasal spray and it seems to never clear.    HYPERTENSION / HYPERLIPIDEMIA Satisfied with current treatment? yes Duration of hypertension: chronic BP monitoring frequency: not checking BP medication side effects: no Past BP meds: benazepril, amlodipine Duration of hyperlipidemia: chronic Cholesterol medication side effects: no Cholesterol supplements: fish oil Past cholesterol medications: none Medication compliance: excellent compliance Aspirin: no Recent stressors: no Recurrent headaches: no Visual changes: no Palpitations: no Dyspnea: no Chest pain: no Lower extremity edema: no Dizzy/lightheaded: no  DEPRESSION Mood status: controlled Satisfied with current treatment?: yes Symptom severity: mild  Duration of current treatment : chronic Side effects: no Medication compliance: excellent compliance Psychotherapy/counseling: no  Previous psychiatric medications: paxil Depressed mood: no Anxious mood: no Anhedonia: no Significant weight loss or gain: no Insomnia: no  Fatigue: no Feelings of worthlessness or guilt: no Impaired concentration/indecisiveness: no Suicidal ideations: no Hopelessness: no Crying spells: no    01/17/2023   11:04 AM 07/13/2022    3:06 PM 06/04/2022    2:50 PM 12/22/2021    8:30 AM 06/01/2021    9:31 AM  Depression screen PHQ 2/9  Decreased Interest 0 0 0 0 0  Down, Depressed, Hopeless 0 0 0 0 0  PHQ - 2 Score 0 0 0 0 0  Altered sleeping 0 3  3 1  0  Tired, decreased energy 0 0 0 0 0  Change in appetite 0 0 0 0 0  Feeling bad or failure about yourself  0 0 0 0 0  Trouble concentrating 0 0 0 0 0  Moving slowly or fidgety/restless 0 0 0 0 0  Suicidal thoughts 0 0 0 0 0  PHQ-9 Score 0 3 3 1  0  Difficult doing work/chores Not difficult at all Not difficult at all Not difficult at all Not difficult at all    Continues with post-nasal drip that doesn't seem to go away. He has had it for months. He has not been able to get rid of it with nasal spray. He is otherwise feeling well. His breathing has been great. No wheezing or cough or SOB. No other concerns or complaints at this time.   Relevant past medical, surgical, family and social history reviewed and updated as indicated. Interim medical history since our last visit reviewed. Allergies and medications reviewed and updated.  Review of Systems  Constitutional: Negative.   HENT:  Positive for postnasal drip. Negative for congestion, dental problem, drooling, ear discharge, ear pain, facial swelling, hearing loss, mouth sores, nosebleeds, rhinorrhea, sinus pressure, sinus pain, sneezing, sore throat, tinnitus, trouble swallowing and voice change.   Respiratory: Negative.    Cardiovascular: Negative.   Gastrointestinal: Negative.   Musculoskeletal: Negative.   Psychiatric/Behavioral: Negative.      Per HPI unless specifically indicated above     Objective:  BP (!) 136/94   Pulse 60   Ht 6\' 2"  (1.88 m)   Wt 275 lb (124.7 kg)   SpO2 97%   BMI 35.31 kg/m   Wt Readings from Last 3 Encounters:  01/17/23 275 lb (124.7 kg)  12/15/22 267 lb 3.2 oz (121.2 kg)  10/27/22 257 lb 9.6 oz (116.8 kg)    Physical Exam Vitals and nursing note reviewed.  Constitutional:      General: He is not in acute distress.    Appearance: Normal appearance. He is not ill-appearing, toxic-appearing or diaphoretic.  HENT:     Head: Normocephalic and atraumatic.     Right Ear: Tympanic membrane,  ear canal and external ear normal.     Left Ear: Tympanic membrane, ear canal and external ear normal.     Nose: Nose normal. No congestion or rhinorrhea.     Mouth/Throat:     Mouth: Mucous membranes are moist.     Pharynx: Oropharynx is clear. Postnasal drip present. No oropharyngeal exudate or posterior oropharyngeal erythema.  Eyes:     General: No scleral icterus.       Right eye: No discharge.        Left eye: No discharge.     Extraocular Movements: Extraocular movements intact.     Conjunctiva/sclera: Conjunctivae normal.     Pupils: Pupils are equal, round, and reactive to light.  Cardiovascular:     Rate and Rhythm: Normal rate and regular rhythm.     Pulses: Normal pulses.     Heart sounds: Normal heart sounds. No murmur heard.    No friction rub. No gallop.  Pulmonary:     Effort: Pulmonary effort is normal. No respiratory distress.     Breath sounds: Normal breath sounds. No stridor. No wheezing, rhonchi or rales.  Chest:     Chest wall: No tenderness.  Musculoskeletal:        General: Normal range of motion.     Cervical back: Normal range of motion and neck supple.  Skin:    General: Skin is warm and dry.     Capillary Refill: Capillary refill takes less than 2 seconds.     Coloration: Skin is not jaundiced or pale.     Findings: No bruising, erythema, lesion or rash.  Neurological:     General: No focal deficit present.     Mental Status: He is alert and oriented to person, place, and time. Mental status is at baseline.  Psychiatric:        Mood and Affect: Mood normal.        Behavior: Behavior normal.        Thought Content: Thought content normal.        Judgment: Judgment normal.     Results for orders placed or performed in visit on 12/15/22  Rapid Strep Screen ([facility] ONLY)   Specimen: Other   Other  Result Value Ref Range   Strep Gp A Ag, IA W/Reflex Negative Negative  Novel Coronavirus, NAA ([facility])   Specimen: Nasopharyngeal(NP) swabs  in vial transport medium  Result Value Ref Range   SARS-CoV-2, NAA Not Detected Not Detected  Culture, Group A Strep   Other  Result Value Ref Range   Strep A Culture Negative   Veritor Flu A/B Waived  Result Value Ref Range   Influenza A Negative Negative   Influenza B Negative Negative      Assessment & Plan:   Problem Hunter Items Addressed This  Visit       Cardiovascular and Mediastinum   Hypertension - Primary    Under good control on current regimen. Continue current regimen. Continue to monitor. Call with any concerns. Refills given. Labs drawn today.       Relevant Medications   amLODipine (NORVASC) 10 MG tablet   benazepril (LOTENSIN) 40 MG tablet   EPINEPHrine (EPIPEN 2-PAK) 0.3 mg/0.3 mL IJ SOAJ injection   Other Relevant Orders   Comprehensive metabolic panel     Respiratory   Asthma    Under good control on current regimen. Continue current regimen. Continue to monitor. Call with any concerns. Refills given.        Relevant Medications   fluticasone furoate-vilanterol (BREO ELLIPTA) 100-25 MCG/ACT AEPB     Other   Hyperlipidemia    Rechecking labs today. Await results. Treat as needed.        Relevant Medications   amLODipine (NORVASC) 10 MG tablet   benazepril (LOTENSIN) 40 MG tablet   EPINEPHrine (EPIPEN 2-PAK) 0.3 mg/0.3 mL IJ SOAJ injection   Other Relevant Orders   Lipid Panel w/o Chol/HDL Ratio   Comprehensive metabolic panel   Depression    Under good control on current regimen. Continue current regimen. Continue to monitor. Call with any concerns. Refills given.        Relevant Medications   PARoxetine (PAXIL) 20 MG tablet   Other Visit Diagnoses     Postnasal drip       Will start flonase. Call if not getting better or getting worse. Continue to monitor.        Follow up plan: Return in about 6 months (around 07/18/2023) for physical.

## 2023-01-17 NOTE — Assessment & Plan Note (Signed)
Under good control on current regimen. Continue current regimen. Continue to monitor. Call with any concerns. Refills given. Labs drawn today.   

## 2023-01-17 NOTE — Assessment & Plan Note (Addendum)
Rechecking labs today. Await results. Treat as needed.  °

## 2023-01-18 LAB — LIPID PANEL W/O CHOL/HDL RATIO
Cholesterol, Total: 298 mg/dL — ABNORMAL HIGH (ref 100–199)
HDL: 56 mg/dL (ref >39)
LDL Chol Calc (NIH): 197 mg/dL — ABNORMAL HIGH (ref 0–99)
Triglycerides: 234 mg/dL — ABNORMAL HIGH (ref 0–149)
VLDL Cholesterol Cal: 45 mg/dL — ABNORMAL HIGH (ref 5–40)

## 2023-01-18 LAB — COMPREHENSIVE METABOLIC PANEL
ALT: 43 [IU]/L (ref 0–44)
AST: 37 [IU]/L (ref 0–40)
Albumin: 4.5 g/dL (ref 3.8–4.9)
Alkaline Phosphatase: 96 [IU]/L (ref 44–121)
BUN/Creatinine Ratio: 13 (ref 9–20)
BUN: 11 mg/dL (ref 6–24)
Bilirubin Total: 0.4 mg/dL (ref 0.0–1.2)
CO2: 22 mmol/L (ref 20–29)
Calcium: 8.9 mg/dL (ref 8.7–10.2)
Chloride: 102 mmol/L (ref 96–106)
Creatinine, Ser: 0.83 mg/dL (ref 0.76–1.27)
Globulin, Total: 2.3 g/dL (ref 1.5–4.5)
Glucose: 94 mg/dL (ref 70–99)
Potassium: 3.9 mmol/L (ref 3.5–5.2)
Sodium: 138 mmol/L (ref 134–144)
Total Protein: 6.8 g/dL (ref 6.0–8.5)
eGFR: 103 mL/min/{1.73_m2} (ref >59)

## 2023-07-14 ENCOUNTER — Other Ambulatory Visit: Payer: Self-pay | Admitting: Family Medicine

## 2023-07-14 ENCOUNTER — Encounter: Payer: Self-pay | Admitting: Family Medicine

## 2023-07-14 ENCOUNTER — Ambulatory Visit (INDEPENDENT_AMBULATORY_CARE_PROVIDER_SITE_OTHER): Payer: Self-pay | Admitting: Family Medicine

## 2023-07-14 VITALS — BP 132/94 | HR 58 | Ht 74.0 in | Wt 271.6 lb

## 2023-07-14 DIAGNOSIS — I10 Essential (primary) hypertension: Secondary | ICD-10-CM

## 2023-07-14 DIAGNOSIS — Z8639 Personal history of other endocrine, nutritional and metabolic disease: Secondary | ICD-10-CM | POA: Diagnosis not present

## 2023-07-14 DIAGNOSIS — F3342 Major depressive disorder, recurrent, in full remission: Secondary | ICD-10-CM

## 2023-07-14 DIAGNOSIS — Z0000 Encounter for general adult medical examination without abnormal findings: Secondary | ICD-10-CM | POA: Diagnosis not present

## 2023-07-14 DIAGNOSIS — E782 Mixed hyperlipidemia: Secondary | ICD-10-CM

## 2023-07-14 LAB — MICROALBUMIN, URINE WAIVED
Creatinine, Urine Waived: 50 mg/dL (ref 10–300)
Microalb, Ur Waived: 10 mg/L (ref 0–19)

## 2023-07-14 MED ORDER — AMLODIPINE BESYLATE 10 MG PO TABS: 10.0000 mg | ORAL_TABLET | Freq: Every day | ORAL | 1 refills | Status: DC

## 2023-07-14 MED ORDER — PAROXETINE HCL 20 MG PO TABS: 20.0000 mg | ORAL_TABLET | Freq: Every day | ORAL | 1 refills | Status: DC

## 2023-07-14 MED ORDER — ALBUTEROL SULFATE HFA 108 (90 BASE) MCG/ACT IN AERS: INHALATION_SPRAY | RESPIRATORY_TRACT | 2 refills | Status: AC

## 2023-07-14 MED ORDER — BENAZEPRIL HCL 40 MG PO TABS: 40.0000 mg | ORAL_TABLET | Freq: Every day | ORAL | 1 refills | Status: DC

## 2023-07-14 MED ORDER — AIRSUPRA 90-80 MCG/ACT IN AERO: 2.0000 | INHALATION_SPRAY | Freq: Four times a day (QID) | RESPIRATORY_TRACT | 3 refills | Status: AC

## 2023-07-14 MED ORDER — FLUTICASONE PROPIONATE 50 MCG/ACT NA SUSP: 2.0000 | Freq: Every day | NASAL | 6 refills | Status: AC

## 2023-07-14 MED ORDER — OMEPRAZOLE 20 MG PO CPDR: 20.0000 mg | DELAYED_RELEASE_CAPSULE | Freq: Every day | ORAL | 2 refills | Status: DC

## 2023-07-14 MED ORDER — FLUTICASONE FUROATE-VILANTEROL 100-25 MCG/ACT IN AEPB: 1.0000 | INHALATION_SPRAY | Freq: Every day | RESPIRATORY_TRACT | 2 refills | Status: AC

## 2023-07-14 NOTE — Assessment & Plan Note (Signed)
 Labs drawn today. Await results.

## 2023-07-14 NOTE — Assessment & Plan Note (Signed)
 Under good control on current regimen. Continue current regimen. Continue to monitor. Call with any concerns. Refills given. Labs drawn today.
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
## 2023-07-14 NOTE — Assessment & Plan Note (Signed)
 Better on recheck but diastolic still high- a lot of stress at work. Will continue current regimen and monitor periodically at home. Call with any concerns.

## 2023-07-14 NOTE — Assessment & Plan Note (Signed)
 Under good control on current regimen. Continue current regimen. Continue to monitor. Call with any concerns. Refills given.

## 2023-07-15 LAB — CBC WITH DIFFERENTIAL/PLATELET
Basophils Absolute: 0 10*3/uL (ref 0.0–0.2)
Basos: 1 %
EOS (ABSOLUTE): 0.2 10*3/uL (ref 0.0–0.4)
Eos: 3 %
Hematocrit: 46.6 % (ref 37.5–51.0)
Hemoglobin: 15.7 g/dL (ref 13.0–17.7)
Immature Grans (Abs): 0 10*3/uL (ref 0.0–0.1)
Immature Granulocytes: 0 %
Lymphocytes Absolute: 2.3 10*3/uL (ref 0.7–3.1)
Lymphs: 34 %
MCH: 32.4 pg (ref 26.6–33.0)
MCHC: 33.7 g/dL (ref 31.5–35.7)
MCV: 96 fL (ref 79–97)
Monocytes Absolute: 0.5 10*3/uL (ref 0.1–0.9)
Monocytes: 7 %
Neutrophils Absolute: 3.7 10*3/uL (ref 1.4–7.0)
Neutrophils: 55 %
Platelets: 221 10*3/uL (ref 150–450)
RBC: 4.84 x10E6/uL (ref 4.14–5.80)
RDW: 13 % (ref 11.6–15.4)
WBC: 6.6 10*3/uL (ref 3.4–10.8)

## 2023-07-15 LAB — COMPREHENSIVE METABOLIC PANEL WITH GFR
ALT: 26 IU/L (ref 0–44)
AST: 22 IU/L (ref 0–40)
Albumin: 4.4 g/dL (ref 3.8–4.9)
Alkaline Phosphatase: 101 IU/L (ref 44–121)
BUN/Creatinine Ratio: 14 (ref 9–20)
BUN: 15 mg/dL (ref 6–24)
Bilirubin Total: 0.4 mg/dL (ref 0.0–1.2)
CO2: 23 mmol/L (ref 20–29)
Calcium: 9.7 mg/dL (ref 8.7–10.2)
Chloride: 103 mmol/L (ref 96–106)
Creatinine, Ser: 1.1 mg/dL (ref 0.76–1.27)
Globulin, Total: 2.5 g/dL (ref 1.5–4.5)
Glucose: 99 mg/dL (ref 70–99)
Potassium: 4.2 mmol/L (ref 3.5–5.2)
Sodium: 140 mmol/L (ref 134–144)
Total Protein: 6.9 g/dL (ref 6.0–8.5)
eGFR: 79 mL/min/{1.73_m2} (ref >59)

## 2023-07-15 LAB — PSA: Prostate Specific Ag, Serum: 1.8 ng/mL (ref 0.0–4.0)

## 2023-07-15 LAB — LIPID PANEL W/O CHOL/HDL RATIO
Cholesterol, Total: 281 mg/dL — ABNORMAL HIGH (ref 100–199)
HDL: 50 mg/dL (ref >39)
LDL Chol Calc (NIH): 172 mg/dL — ABNORMAL HIGH (ref 0–99)
Triglycerides: 307 mg/dL — ABNORMAL HIGH (ref 0–149)
VLDL Cholesterol Cal: 59 mg/dL — ABNORMAL HIGH (ref 5–40)

## 2023-07-15 LAB — TSH: TSH: 7.21 u[IU]/mL — ABNORMAL HIGH (ref 0.450–4.500)

## 2023-07-25 ENCOUNTER — Ambulatory Visit: Payer: Self-pay | Admitting: Family Medicine

## 2023-07-25 DIAGNOSIS — E039 Hypothyroidism, unspecified: Secondary | ICD-10-CM | POA: Insufficient documentation

## 2023-07-25 MED ORDER — LEVOTHYROXINE SODIUM 25 MCG PO TABS: 25.0000 ug | ORAL_TABLET | Freq: Every day | ORAL | 1 refills | Status: DC

## 2023-07-26 NOTE — Progress Notes (Signed)
 Called patient , left message to call office and schedule appt.

## 2023-07-28 NOTE — Progress Notes (Signed)
 Appt scheduled

## 2023-09-05 ENCOUNTER — Ambulatory Visit: Payer: Self-pay

## 2023-09-05 NOTE — Telephone Encounter (Signed)
 FYI Only or Action Required?: FYI only for provider.  Patient was last seen in primary care on 07/14/2023 by Vicci Duwaine SQUIBB, DO.  Called Nurse Triage reporting Jaw Pain.  Symptoms began several weeks ago.  Interventions attempted: Nothing.  Symptoms are: unchanged.  Triage Disposition: Home Care  Patient/caregiver understands and will follow disposition?: Yes                    Copied from CRM [id number]. Topic: Clinical - Red Word Triage >> Sep 05, 2023 12:14 PM Anthony Hunter wrote: Red Word that prompted transfer to Nurse Triage: Patient states he thinks he has a left ear infection. Currently experiencing pain in his jaw.  Scheduled for a 6 week follow up on 07/23 but unable to make. New symptoms in the last two weeks. Reason for Disposition  Mild earache and ear congestion (fullness) occurring during air travel  Answer Assessment - Initial Assessment Questions 1. LOCATION: Which ear is involved?     Left ear pain 2. ONSET: When did the ear pain start?      Three weeks ago 3. SEVERITY: How bad is the pain?  (Scale 1-10; mild, moderate or severe)     mild 4. URI SYMPTOMS: Do you have a runny nose or cough?     denies 5. FEVER: Do you have a fever? If Yes, ask: What is your temperature, how was it measured, and when did it start?     no 6. CAUSE: Have you been swimming recently?, How often do you use Q-TIPS?, Have you had any recent air travel or scuba diving?     no 7. OTHER SYMPTOMS: Do you have any other symptoms? (e.g., decreased hearing, dizziness, headache, stiff neck, vomiting)     Denies.  8. PREGNANCY: Is there any chance you are pregnant? When was your last menstrual period?     na  Protocols used: Rilla

## 2023-09-06 NOTE — Telephone Encounter (Signed)
 September appointment is not appropriate for acute ear pain. Please get scheduled with myself or other provider in the next 2 weeks. OK to use same day

## 2023-09-06 NOTE — Telephone Encounter (Signed)
Scheduled for 7/29.

## 2023-09-07 ENCOUNTER — Ambulatory Visit: Admitting: Family Medicine

## 2023-09-13 ENCOUNTER — Ambulatory Visit (INDEPENDENT_AMBULATORY_CARE_PROVIDER_SITE_OTHER): Admitting: Family Medicine

## 2023-09-13 ENCOUNTER — Encounter: Payer: Self-pay | Admitting: Family Medicine

## 2023-09-13 VITALS — BP 138/88 | HR 59 | Temp 97.8°F | Ht 74.0 in | Wt 275.4 lb

## 2023-09-13 DIAGNOSIS — E782 Mixed hyperlipidemia: Secondary | ICD-10-CM | POA: Diagnosis not present

## 2023-09-13 DIAGNOSIS — M26622 Arthralgia of left temporomandibular joint: Secondary | ICD-10-CM

## 2023-09-13 DIAGNOSIS — E039 Hypothyroidism, unspecified: Secondary | ICD-10-CM

## 2023-09-13 DIAGNOSIS — I10 Essential (primary) hypertension: Secondary | ICD-10-CM | POA: Diagnosis not present

## 2023-09-13 DIAGNOSIS — H6123 Impacted cerumen, bilateral: Secondary | ICD-10-CM | POA: Diagnosis not present

## 2023-09-13 MED ORDER — CYCLOBENZAPRINE HCL 10 MG PO TABS: 5.0000 mg | ORAL_TABLET | Freq: Every evening | ORAL | 1 refills | Status: AC | PRN

## 2023-09-13 MED ORDER — HYDROCHLOROTHIAZIDE 25 MG PO TABS
25.0000 mg | ORAL_TABLET | Freq: Every day | ORAL | 2 refills | Status: DC
Start: 2023-09-13 — End: 2023-11-01

## 2023-09-13 NOTE — Assessment & Plan Note (Signed)
 Running high at home, better on recheck- will start hydrochlorothiazide  and recheck in 6 weeks.

## 2023-09-13 NOTE — Progress Notes (Signed)
 BP 138/88   Pulse (!) 59   Temp 97.8 F (36.6 C) (Oral)   Ht 6' 2 (1.88 m)   Wt 275 lb 6.4 oz (124.9 kg)   SpO2 96%   BMI 35.36 kg/m    Subjective:    Patient ID: Anthony Hunter, male    DOB: 1965-11-22, 58 y.o.   MRN: [id number]  HPI: Anthony Hunter is a 58 y.o. male  Chief Complaint  Patient presents with   Ear Pain    Left ear. Onset about 3 weeks.    EAR PAIN Duration: weeks Involved ear(s): left Severity:  moderate  Quality:  aching and sore Fever: no Otorrhea: no Upper respiratory infection symptoms: no Pruritus: no Hearing loss: no Water immersion no Using Q-tips: no Recurrent otitis media: no Status: stable Treatments attempted: prednisone - made it go away, but then came back when he finished it  HYPOTHYROIDISM Thyroid control status:stable Satisfied with current treatment? yes Medication side effects: no Medication compliance: excellent compliance Recent dose adjustment:yes Fatigue: no Cold intolerance: no Heat intolerance: no Weight gain: no Weight loss: no Constipation: no Diarrhea/loose stools: no Palpitations: no Lower extremity edema: no Anxiety/depressed mood: no  HYPERTENSION  Hypertension status: uncontrolled  Satisfied with current treatment? no Duration of hypertension: chronic BP monitoring frequency:  a few times a month BP range: 140s+ BP medication side effects:  no Medication compliance: excellent compliance Previous BP meds: amlodipine , benazepril  Aspirin: no Recurrent headaches: no Visual changes: no Palpitations: no Dyspnea: no Chest pain: no Lower extremity edema: no Dizzy/lightheaded: no   Relevant past medical, surgical, family and social history reviewed and updated as indicated. Interim medical history since our last visit reviewed. Allergies and medications reviewed and updated.  Review of Systems  Constitutional: Negative.   Respiratory: Negative.    Cardiovascular: Negative.   Musculoskeletal:  Positive for  arthralgias. Negative for back pain, gait problem, joint swelling, myalgias, neck pain and neck stiffness.  Skin: Negative.   Neurological: Negative.   Psychiatric/Behavioral: Negative.      Per HPI unless specifically indicated above     Objective:    BP 138/88   Pulse (!) 59   Temp 97.8 F (36.6 C) (Oral)   Ht 6' 2 (1.88 m)   Wt 275 lb 6.4 oz (124.9 kg)   SpO2 96%   BMI 35.36 kg/m   Wt Readings from Last 3 Encounters:  09/13/23 275 lb 6.4 oz (124.9 kg)  07/14/23 271 lb 9.6 oz (123.2 kg)  01/17/23 275 lb (124.7 kg)    Physical Exam Vitals and nursing note reviewed.  Constitutional:      General: He is not in acute distress.    Appearance: Normal appearance. He is not ill-appearing, toxic-appearing or diaphoretic.  HENT:     Head: Normocephalic and atraumatic.     Right Ear: External ear normal. There is impacted cerumen.     Left Ear: External ear normal. There is impacted cerumen.     Nose: Nose normal.     Mouth/Throat:     Mouth: Mucous membranes are moist.     Pharynx: Oropharynx is clear.  Eyes:     General: No scleral icterus.       Right eye: No discharge.        Left eye: No discharge.     Extraocular Movements: Extraocular movements intact.     Conjunctiva/sclera: Conjunctivae normal.     Pupils: Pupils are equal, round, and reactive to light.  Cardiovascular:  Rate and Rhythm: Normal rate and regular rhythm.     Pulses: Normal pulses.     Heart sounds: Normal heart sounds. No murmur heard.    No friction rub. No gallop.  Pulmonary:     Effort: Pulmonary effort is normal. No respiratory distress.     Breath sounds: Normal breath sounds. No stridor. No wheezing, rhonchi or rales.  Chest:     Chest wall: No tenderness.  Musculoskeletal:        General: Normal range of motion.     Cervical back: Normal range of motion and neck supple.  Skin:    General: Skin is warm and dry.     Capillary Refill: Capillary refill takes less than 2 seconds.      Coloration: Skin is not jaundiced or pale.     Findings: No bruising, erythema, lesion or rash.  Neurological:     General: No focal deficit present.     Mental Status: He is alert and oriented to person, place, and time. Mental status is at baseline.  Psychiatric:        Mood and Affect: Mood normal.        Behavior: Behavior normal.        Thought Content: Thought content normal.        Judgment: Judgment normal.     Results for orders placed or performed in visit on 07/14/23  Comprehensive metabolic panel with GFR   Collection Time: 07/14/23  9:04 AM  Result Value Ref Range   Glucose 99 70 - 99 mg/dL   BUN 15 6 - 24 mg/dL   Creatinine, Ser 8.89 0.76 - 1.27 mg/dL   eGFR 79 >40 fO/fpw/8.26   BUN/Creatinine Ratio 14 9 - 20   Sodium 140 134 - 144 mmol/L   Potassium 4.2 3.5 - 5.2 mmol/L   Chloride 103 96 - 106 mmol/L   CO2 23 20 - 29 mmol/L   Calcium  9.7 8.7 - 10.2 mg/dL   Total Protein 6.9 6.0 - 8.5 g/dL   Albumin 4.4 3.8 - 4.9 g/dL   Globulin, Total 2.5 1.5 - 4.5 g/dL   Bilirubin Total 0.4 0.0 - 1.2 mg/dL   Alkaline Phosphatase 101 44 - 121 IU/L   AST 22 0 - 40 IU/L   ALT 26 0 - 44 IU/L  CBC with Differential/Platelet   Collection Time: 07/14/23  9:04 AM  Result Value Ref Range   WBC 6.6 3.4 - 10.8 x10E3/uL   RBC 4.84 4.14 - 5.80 x10E6/uL   Hemoglobin 15.7 13.0 - 17.7 g/dL   Hematocrit 53.3 62.4 - 51.0 %   MCV 96 79 - 97 fL   MCH 32.4 26.6 - 33.0 pg   MCHC 33.7 31.5 - 35.7 g/dL   RDW 86.9 88.3 - 84.5 %   Platelets 221 150 - 450 x10E3/uL   Neutrophils 55 Not Estab. %   Lymphs 34 Not Estab. %   Monocytes 7 Not Estab. %   Eos 3 Not Estab. %   Basos 1 Not Estab. %   Neutrophils Absolute 3.7 1.4 - 7.0 x10E3/uL   Lymphocytes Absolute 2.3 0.7 - 3.1 x10E3/uL   Monocytes Absolute 0.5 0.1 - 0.9 x10E3/uL   EOS (ABSOLUTE) 0.2 0.0 - 0.4 x10E3/uL   Basophils Absolute 0.0 0.0 - 0.2 x10E3/uL   Immature Granulocytes 0 Not Estab. %   Immature Grans (Abs) 0.0 0.0 - 0.1 x10E3/uL   Lipid Panel w/o Chol/HDL Ratio   Collection Time: 07/14/23  9:04  AM  Result Value Ref Range   Cholesterol, Total 281 (H) 100 - 199 mg/dL   Triglycerides 692 (H) 0 - 149 mg/dL   HDL 50 >60 mg/dL   VLDL Cholesterol Cal 59 (H) 5 - 40 mg/dL   LDL Chol Calc (NIH) 827 (H) 0 - 99 mg/dL  PSA   Collection Time: 07/14/23  9:04 AM  Result Value Ref Range   Prostate Specific Ag, Serum 1.8 0.0 - 4.0 ng/mL  TSH   Collection Time: 07/14/23  9:04 AM  Result Value Ref Range   TSH 7.210 (H) 0.450 - 4.500 uIU/mL  Microalbumin, Urine Waived   Collection Time: 07/14/23  9:04 AM  Result Value Ref Range   Microalb, Ur Waived 10 0 - 19 mg/L   Creatinine, Urine Waived 50 10 - 300 mg/dL   Microalb/Creat Ratio 30-300 (H) <30 mg/g      Assessment & Plan:   Problem List Items Addressed This Visit       Cardiovascular and Mediastinum   Hypertension   Running high at home, better on recheck- will start hydrochlorothiazide  and recheck in 6 weeks.       Relevant Medications   hydrochlorothiazide  (HYDRODIURIL ) 25 MG tablet   Other Relevant Orders   Comprehensive metabolic panel with GFR     Endocrine   Hypothyroidism   Rechecking labs today and treat as needed. Call with any concerns.       Relevant Orders   TSH     Other   Hyperlipidemia   Rechecking labs today. Await results. Treat as needed.       Relevant Medications   hydrochlorothiazide  (HYDRODIURIL ) 25 MG tablet   Other Relevant Orders   Comprehensive metabolic panel with GFR   Lipid Panel w/o Chol/HDL Ratio   Other Visit Diagnoses       Arthralgia of left temporomandibular joint    -  Primary   Will treat with flexeril  and stretches. Call if not getting better. Talk to dentist about mouthguard.     Bilateral impacted cerumen       Flushed today with good results.        Follow up plan: Return in about 6 weeks (around 10/25/2023).

## 2023-09-13 NOTE — Assessment & Plan Note (Signed)
 Rechecking labs today. Await results. Treat as needed.

## 2023-09-13 NOTE — Assessment & Plan Note (Signed)
 Rechecking labs today and treat as needed. Call with any concerns.

## 2023-09-14 ENCOUNTER — Encounter: Payer: Self-pay | Admitting: Family Medicine

## 2023-09-14 ENCOUNTER — Ambulatory Visit: Payer: Self-pay | Admitting: Family Medicine

## 2023-09-14 DIAGNOSIS — R748 Abnormal levels of other serum enzymes: Secondary | ICD-10-CM

## 2023-09-14 LAB — LIPID PANEL W/O CHOL/HDL RATIO
Cholesterol, Total: 253 mg/dL — ABNORMAL HIGH (ref 100–199)
HDL: 45 mg/dL (ref >39)
LDL Chol Calc (NIH): 141 mg/dL — ABNORMAL HIGH (ref 0–99)
Triglycerides: 367 mg/dL — ABNORMAL HIGH (ref 0–149)
VLDL Cholesterol Cal: 67 mg/dL — ABNORMAL HIGH (ref 5–40)

## 2023-09-14 LAB — COMPREHENSIVE METABOLIC PANEL WITH GFR
ALT: 99 IU/L — ABNORMAL HIGH (ref 0–44)
AST: 220 IU/L — ABNORMAL HIGH (ref 0–40)
Albumin: 4.4 g/dL (ref 3.8–4.9)
Alkaline Phosphatase: 79 IU/L (ref 44–121)
BUN/Creatinine Ratio: 18 (ref 9–20)
BUN: 15 mg/dL (ref 6–24)
Bilirubin Total: 0.4 mg/dL (ref 0.0–1.2)
CO2: 21 mmol/L (ref 20–29)
Calcium: 9.1 mg/dL (ref 8.7–10.2)
Chloride: 99 mmol/L (ref 96–106)
Creatinine, Ser: 0.85 mg/dL (ref 0.76–1.27)
Globulin, Total: 2.1 g/dL (ref 1.5–4.5)
Glucose: 90 mg/dL (ref 70–99)
Potassium: 3.6 mmol/L (ref 3.5–5.2)
Sodium: 137 mmol/L (ref 134–144)
Total Protein: 6.5 g/dL (ref 6.0–8.5)
eGFR: 101 mL/min/1.73 (ref >59)

## 2023-09-14 LAB — TSH: TSH: 3.29 u[IU]/mL (ref 0.450–4.500)

## 2023-09-14 MED ORDER — LEVOTHYROXINE SODIUM 25 MCG PO TABS: 25.0000 ug | ORAL_TABLET | Freq: Every day | ORAL | 3 refills | Status: AC

## 2023-09-16 NOTE — Progress Notes (Signed)
 Lab appt scheduled.

## 2023-09-20 ENCOUNTER — Ambulatory Visit
Admission: RE | Admit: 2023-09-20 | Discharge: 2023-09-20 | Disposition: A | Discharge status: Home / Self Care | Source: Ambulatory Visit | Attending: Family Medicine | Admitting: Family Medicine

## 2023-09-20 DIAGNOSIS — R748 Abnormal levels of other serum enzymes: Secondary | ICD-10-CM | POA: Insufficient documentation

## 2023-09-23 ENCOUNTER — Ambulatory Visit: Payer: Self-pay | Admitting: Family Medicine

## 2023-09-23 DIAGNOSIS — R932 Abnormal findings on diagnostic imaging of liver and biliary tract: Secondary | ICD-10-CM

## 2023-09-23 DIAGNOSIS — R748 Abnormal levels of other serum enzymes: Secondary | ICD-10-CM

## 2023-09-30 ENCOUNTER — Other Ambulatory Visit

## 2023-09-30 DIAGNOSIS — R932 Abnormal findings on diagnostic imaging of liver and biliary tract: Secondary | ICD-10-CM

## 2023-09-30 DIAGNOSIS — R748 Abnormal levels of other serum enzymes: Secondary | ICD-10-CM

## 2023-10-01 ENCOUNTER — Ambulatory Visit
Admission: RE | Admit: 2023-10-01 | Discharge: 2023-10-01 | Disposition: A | Discharge status: Home / Self Care | Source: Ambulatory Visit | Attending: Family Medicine | Admitting: Family Medicine

## 2023-10-01 DIAGNOSIS — R932 Abnormal findings on diagnostic imaging of liver and biliary tract: Secondary | ICD-10-CM | POA: Diagnosis present

## 2023-10-01 DIAGNOSIS — R748 Abnormal levels of other serum enzymes: Secondary | ICD-10-CM | POA: Insufficient documentation

## 2023-10-01 LAB — COMPREHENSIVE METABOLIC PANEL WITH GFR
ALT: 33 IU/L (ref 0–44)
AST: 26 IU/L (ref 0–40)
Albumin: 4.4 g/dL (ref 3.8–4.9)
Alkaline Phosphatase: 81 IU/L (ref 44–121)
BUN/Creatinine Ratio: 12 (ref 9–20)
BUN: 13 mg/dL (ref 6–24)
Bilirubin Total: 0.5 mg/dL (ref 0.0–1.2)
CO2: 20 mmol/L (ref 20–29)
Calcium: 9.4 mg/dL (ref 8.7–10.2)
Chloride: 97 mmol/L (ref 96–106)
Creatinine, Ser: 1.05 mg/dL (ref 0.76–1.27)
Globulin, Total: 2.4 g/dL (ref 1.5–4.5)
Glucose: 99 mg/dL (ref 70–99)
Potassium: 3.4 mmol/L — ABNORMAL LOW (ref 3.5–5.2)
Sodium: 137 mmol/L (ref 134–144)
Total Protein: 6.8 g/dL (ref 6.0–8.5)
eGFR: 83 mL/min/1.73 (ref >59)

## 2023-10-01 LAB — ACUTE HEP PANEL AND HEP B SURFACE AB
Hep A IgM: NEGATIVE
Hep B C IgM: NEGATIVE
Hep C Virus Ab: NONREACTIVE
Hepatitis B Surf Ab Quant: <3.5 m[IU]/mL — ABNORMAL LOW
Hepatitis B Surface Ag: NEGATIVE

## 2023-10-01 MED ORDER — GADOBUTROL 1 MMOL/ML IV SOLN
10.0000 mL | Freq: Once | INTRAVENOUS | Status: AC | PRN
  Administered 2023-10-01: 10 mL via INTRAVENOUS

## 2023-10-04 ENCOUNTER — Ambulatory Visit: Payer: Self-pay | Admitting: Family Medicine

## 2023-10-11 ENCOUNTER — Encounter: Payer: Self-pay | Admitting: Family Medicine

## 2023-10-21 ENCOUNTER — Other Ambulatory Visit (HOSPITAL_COMMUNITY): Payer: Self-pay

## 2023-10-21 ENCOUNTER — Ambulatory Visit: Admitting: Family Medicine

## 2023-10-21 ENCOUNTER — Telehealth: Payer: Self-pay

## 2023-10-21 NOTE — Telephone Encounter (Signed)
 Pharmacy Patient Advocate Encounter   Received notification from Patient Advice Request messages that prior authorization for Wegovy 0.25mg /0.47ml is required/requested.   Insurance verification completed.   The patient is insured through [employer] .   Per test claim: PA required; PA submitted to above mentioned insurance via Latent Key/confirmation #/EOC [id number] Status is pending

## 2023-10-21 NOTE — Telephone Encounter (Signed)
 No GLP-1 prescription has been written for pt. I see that he is requesting Wegovy, so I have submitted a PA for the lowest strength of Wegovy, please let me know if I need to change this, thank you.

## 2023-10-25 NOTE — Telephone Encounter (Signed)
 Auth Submission: APPROVED Payer: [employer] Diagnosis Code: Z68.35 - Body mass index [BMI] 35.0-35.9, adult Route of submission (phone, fax, portal): Latent Units/visits requested: 2 [place] Reference number: [id number] Approval from: 10/21/2023 to 05/20/2024

## 2023-11-01 ENCOUNTER — Encounter: Payer: Self-pay | Admitting: Family Medicine

## 2023-11-01 ENCOUNTER — Ambulatory Visit: Admitting: Family Medicine

## 2023-11-01 VITALS — BP 133/88 | HR 57 | Ht 74.0 in | Wt 269.0 lb

## 2023-11-01 DIAGNOSIS — E66811 Obesity, class 1: Secondary | ICD-10-CM | POA: Diagnosis not present

## 2023-11-01 DIAGNOSIS — E6609 Other obesity due to excess calories: Secondary | ICD-10-CM

## 2023-11-01 DIAGNOSIS — I10 Essential (primary) hypertension: Secondary | ICD-10-CM | POA: Diagnosis not present

## 2023-11-01 DIAGNOSIS — Z23 Encounter for immunization: Secondary | ICD-10-CM

## 2023-11-01 DIAGNOSIS — Z6834 Body mass index (BMI) 34.0-34.9, adult: Secondary | ICD-10-CM

## 2023-11-01 DIAGNOSIS — Z789 Other specified health status: Secondary | ICD-10-CM | POA: Diagnosis not present

## 2023-11-01 MED ORDER — WEGOVY 0.25 MG/0.5ML ~~LOC~~ SOAJ: 0.2500 mg | SUBCUTANEOUS | 0 refills | Status: DC

## 2023-11-01 MED ORDER — WEGOVY 0.5 MG/0.5ML ~~LOC~~ SOAJ: 0.5000 mg | SUBCUTANEOUS | 0 refills | Status: DC

## 2023-11-01 MED ORDER — HYDROCHLOROTHIAZIDE 25 MG PO TABS: 25.0000 mg | ORAL_TABLET | Freq: Every day | ORAL | 1 refills | Status: DC

## 2023-11-01 NOTE — Assessment & Plan Note (Signed)
 Would like to start wegovy . Rx sent to his pharmacy. Call with any concerns. Follow up 6 weeks.

## 2023-11-01 NOTE — Progress Notes (Signed)
 BP 133/88 (BP Location: Left Arm, Patient Position: Sitting, Cuff Size: Large)   Pulse (!) 57   Ht 6' 2 (1.88 m)   Wt 269 lb (122 kg)   SpO2 96%   BMI 34.54 kg/m    Subjective:    Patient ID: Anthony Hunter, male    DOB: 1965/08/10, 58 y.o.   MRN: [id number]  HPI: Anthony Hunter is a 58 y.o. male  Chief Complaint  Patient presents with   Obesity    Discuss Wegovy  rx   Hypertension   HYPERTENSION  Hypertension status: controlled  Satisfied with current treatment? yes Duration of hypertension: chronic BP monitoring frequency:  not checking BP medication side effects:  no Medication compliance: excellent compliance Previous BP meds:amlodipine , benazepril , HCTZ Aspirin: no Recurrent headaches: no Visual changes: no Palpitations: no Dyspnea: no Chest pain: no Lower extremity edema: no Dizzy/lightheaded: no  WEIGHT GAIN Duration: chronic Previous attempts at weight loss: has been doing diet and exercise, portion control, going to the gym 3x a week Complications of obesity: HTN, HLD, depression, varicose veins Peak weight: 275# Weight loss goal: 220# Weight loss to date: 6# Requesting obesity pharmacotherapy: yes Current weight loss supplements/medications: no Previous weight loss supplements/meds: no  Clinical coverage for weight loss GLP's  Medication being dispensed is Wegovy  3 mL/28 day. Titration doses are 2 mL/28 days.  [x]  Product being prescribed is FDA approved for the indication, age, weight (if applicable) and not does not exceed dosing limits per the Prescribing Information per the clinical conditions for use.  [x]  Patient's baseline weight measured within the last 45 days as required by provider before dispensing.  [x]  Patient is new to therapy and One of the following:   [x]  The beneficiary is 57 years of age or over and has ONE of the following:  [x]  A BMI greater than or equal to 30 kg/m2  [x]  A BMI greater than or equal to 27 kg/m2 with at least one  weight-related comorbidity/risk factor/complication (i.e. hypertension, type 2 diabetes, obstructive sleep apnea, cardiovascular disease, dyslipidemia)  [x]  If patient has one weight-related comorbidity/risk factor/complication (i.e. hypertension, type 2 diabetes, obstructive sleep apnea, cardiovascular disease, dyslipidemia), Patient suffers from weight-related comorbidity/risk factor/complication HTN, HLD, depression, varicose veins  [x]  The beneficiary has participated in 6 months or greater of lifestyle modification and will continue while on medication including structured nutrition following provider recommend dietary modifications and physical activity, unless physical activity is not clinically appropriate at the time GLP1 therapy commences AND  [x]  The beneficiary will NOT be using the requested agent in combination with another GLP-1 receptor agonist agent AND  [x]  The beneficiary does NOT have any FDA-labeled contraindications to the requested agent, including pregnancy, lactation, history of medullary thyroid cancer or multiple endocrine neoplasia type II.  Last BMI/Weight/Height recorded Estimated body mass index is 34.54 kg/m as calculated from the following:   Height as of this encounter: 6' 2 (1.88 m).   Weight as of this encounter: 269 lb (122 kg).   Relevant past medical, surgical, family and social history reviewed and updated as indicated. Interim medical history since our last visit reviewed. Allergies and medications reviewed and updated.  Review of Systems  Constitutional: Negative.   Respiratory: Negative.    Cardiovascular: Negative.   Musculoskeletal: Negative.   Neurological: Negative.   Psychiatric/Behavioral: Negative.      Per HPI unless specifically indicated above     Objective:    BP 133/88 (BP Location: Left Arm, Patient Position: Sitting, Cuff  Size: Large)   Pulse (!) 57   Ht 6' 2 (1.88 m)   Wt 269 lb (122 kg)   SpO2 96%   BMI 34.54 kg/m    Wt Readings from Last 3 Encounters:  11/01/23 269 lb (122 kg)  09/13/23 275 lb 6.4 oz (124.9 kg)  07/14/23 271 lb 9.6 oz (123.2 kg)    Physical Exam Vitals and nursing note reviewed.  Constitutional:      General: He is not in acute distress.    Appearance: Normal appearance. He is not ill-appearing, toxic-appearing or diaphoretic.  HENT:     Head: Normocephalic and atraumatic.     Right Ear: External ear normal.     Left Ear: External ear normal.     Nose: Nose normal.     Mouth/Throat:     Mouth: Mucous membranes are moist.     Pharynx: Oropharynx is clear.  Eyes:     General: No scleral icterus.       Right eye: No discharge.        Left eye: No discharge.     Extraocular Movements: Extraocular movements intact.     Conjunctiva/sclera: Conjunctivae normal.     Pupils: Pupils are equal, round, and reactive to light.  Cardiovascular:     Rate and Rhythm: Normal rate and regular rhythm.     Pulses: Normal pulses.     Heart sounds: Normal heart sounds. No murmur heard.    No friction rub. No gallop.  Pulmonary:     Effort: Pulmonary effort is normal. No respiratory distress.     Breath sounds: Normal breath sounds. No stridor. No wheezing, rhonchi or rales.  Chest:     Chest wall: No tenderness.  Musculoskeletal:        General: Normal range of motion.     Cervical back: Normal range of motion and neck supple.  Skin:    General: Skin is warm and dry.     Capillary Refill: Capillary refill takes less than 2 seconds.     Coloration: Skin is not jaundiced or pale.     Findings: No bruising, erythema, lesion or rash.  Neurological:     General: No focal deficit present.     Mental Status: He is alert and oriented to person, place, and time. Mental status is at baseline.  Psychiatric:        Mood and Affect: Mood normal.        Behavior: Behavior normal.        Thought Content: Thought content normal.        Judgment: Judgment normal.     Results for orders placed or  performed in visit on 09/30/23  Acute Hep Panel & Hep B Surface Ab   Collection Time: 09/30/23  8:45 AM  Result Value Ref Range   Hep A IgM Negative Negative   Hepatitis B Surface Ag Negative Negative   Hep B C IgM Negative Negative   Hepatitis B Surf Ab Quant <3.5 (L) Immunity>10 mIU/mL   Hep C Virus Ab Non Reactive Non Reactive  Comprehensive metabolic panel with GFR   Collection Time: 09/30/23  8:45 AM  Result Value Ref Range   Glucose 99 70 - 99 mg/dL   BUN 13 6 - 24 mg/dL   Creatinine, Ser 8.94 0.76 - 1.27 mg/dL   eGFR 83 >40 fO/fpw/8.26   BUN/Creatinine Ratio 12 9 - 20   Sodium 137 134 - 144 mmol/L   Potassium 3.4 (L) 3.5 -  5.2 mmol/L   Chloride 97 96 - 106 mmol/L   CO2 20 20 - 29 mmol/L   Calcium  9.4 8.7 - 10.2 mg/dL   Total Protein 6.8 6.0 - 8.5 g/dL   Albumin 4.4 3.8 - 4.9 g/dL   Globulin, Total 2.4 1.5 - 4.5 g/dL   Bilirubin Total 0.5 0.0 - 1.2 mg/dL   Alkaline Phosphatase 81 44 - 121 IU/L   AST 26 0 - 40 IU/L   ALT 33 0 - 44 IU/L      Assessment & Plan:   Problem List Items Addressed This Visit       Cardiovascular and Mediastinum   Hypertension - Primary   Under good control on current regimen. Continue current regimen. Continue to monitor. Call with any concerns. Refills given. Labs drawn today.        Relevant Medications   hydrochlorothiazide  (HYDRODIURIL ) 25 MG tablet   Other Relevant Orders   Basic metabolic panel with GFR     Other   Class 1 obesity due to excess calories with serious comorbidity and body mass index (BMI) of 34.0 to 34.9 in adult   Would like to start wegovy . Rx sent to his pharmacy. Call with any concerns. Follow up 6 weeks.       Relevant Medications   semaglutide -weight management (WEGOVY ) 0.25 MG/0.5ML SOAJ SQ injection   semaglutide -weight management (WEGOVY ) 0.5 MG/0.5ML SOAJ SQ injection (Start on 11/29/2023)   Other Visit Diagnoses       Not immune to hepatitis B virus       Hep B #1 given today.        Follow  up plan: Return in about 6 weeks (around 12/13/2023).

## 2023-11-01 NOTE — Assessment & Plan Note (Signed)
 Under good control on current regimen. Continue current regimen. Continue to monitor. Call with any concerns. Refills given. Labs drawn today.

## 2023-11-02 ENCOUNTER — Ambulatory Visit: Payer: Self-pay | Admitting: Family Medicine

## 2023-11-02 LAB — BASIC METABOLIC PANEL WITH GFR
BUN/Creatinine Ratio: 20 (ref 9–20)
BUN: 20 mg/dL (ref 6–24)
CO2: 21 mmol/L (ref 20–29)
Calcium: 9.3 mg/dL (ref 8.7–10.2)
Chloride: 99 mmol/L (ref 96–106)
Creatinine, Ser: 1.01 mg/dL (ref 0.76–1.27)
Glucose: 100 mg/dL — ABNORMAL HIGH (ref 70–99)
Potassium: 3.3 mmol/L — ABNORMAL LOW (ref 3.5–5.2)
Sodium: 134 mmol/L (ref 134–144)
eGFR: 87 mL/min/1.73 (ref >59)

## 2023-12-02 ENCOUNTER — Other Ambulatory Visit: Payer: Self-pay | Admitting: Family Medicine

## 2023-12-02 NOTE — Telephone Encounter (Unsigned)
 Copied from CRM #[id number]. Topic: Clinical - Medication Refill >> Dec 02, 2023 10:59 AM Hadassah PARAS wrote: Medication: semaglutide -weight management (WEGOVY ) 0.5 MG/0.5ML SOAJ SQ injection  Has the patient contacted their pharmacy? No (Agent: If no, request that the patient contact the pharmacy for the refill. If patient does not wish to contact the pharmacy document the reason why and proceed with request.) (Agent: If yes, when and what did the pharmacy advise?)  This is the patient's preferred pharmacy:  [id number] - [address] [place] [address] Phone: [phone] Fax: [phone]  Is this the correct pharmacy for this prescription? Yes If no, delete pharmacy and type the correct one.   Has the prescription been filled recently? Yes  Is the patient out of the medication? Yes  Has the patient been seen for an appointment in the last year OR does the patient have an upcoming appointment? Yes  Can we respond through MyChart? Yes  Agent: Please be advised that Rx refills may take up to 3 business days. We ask that you follow-up with your pharmacy.

## 2023-12-05 MED ORDER — WEGOVY 0.5 MG/0.5ML ~~LOC~~ SOAJ: 0.5000 mg | SUBCUTANEOUS | 0 refills | Status: AC

## 2023-12-05 NOTE — Telephone Encounter (Signed)
 Requested by patient. Last receipt confirmed by pharmacy 11/01/23. Future visit 12/16/23.  Requested Prescriptions  Pending Prescriptions Disp Refills   semaglutide -weight management (WEGOVY ) 0.5 MG/0.5ML SOAJ SQ injection 2 mL 0    Sig: Inject 0.5 mg into the skin once a week.     Endocrinology:  Diabetes - GLP-1 Receptor Agonists - semaglutide  Failed - 12/05/2023  2:32 PM      Failed - HBA1C in normal range and within 180 days    Hemoglobin A1C  Date Value Ref Range Status  09/30/2016 5.10  Final   Hgb A1c MFr Bld  Date Value Ref Range Status  10/07/2017 5.2 4.8 - 5.6 % Final    Comment:             Prediabetes: 5.7 - 6.4          Diabetes: >6.4          Glycemic control for adults with diabetes: <7.0          Passed - Cr in normal range and within 360 days    Creatinine  Date Value Ref Range Status  04/12/2013 0.94 0.60 - 1.30 mg/dL Final   Creatinine, Ser  Date Value Ref Range Status  11/01/2023 1.01 0.76 - 1.27 mg/dL Final         Passed - Valid encounter within last 6 months    Recent Outpatient Visits           1 month ago Primary hypertension   [facility] [place], [name], DO   2 months ago Arthralgia of left temporomandibular joint   [place] [facility] [place], [name], DO   4 months ago Routine general medical examination at a health care facility   [facility] [place], [place], DO

## 2023-12-16 ENCOUNTER — Encounter: Payer: Self-pay | Admitting: Family Medicine

## 2023-12-16 ENCOUNTER — Ambulatory Visit (INDEPENDENT_AMBULATORY_CARE_PROVIDER_SITE_OTHER): Admitting: Family Medicine

## 2023-12-16 VITALS — BP 126/89 | HR 63 | Temp 97.8°F | Ht 74.0 in | Wt 259.2 lb

## 2023-12-16 DIAGNOSIS — E66811 Obesity, class 1: Secondary | ICD-10-CM | POA: Diagnosis not present

## 2023-12-16 DIAGNOSIS — Z23 Encounter for immunization: Secondary | ICD-10-CM

## 2023-12-16 DIAGNOSIS — E782 Mixed hyperlipidemia: Secondary | ICD-10-CM

## 2023-12-16 DIAGNOSIS — E6609 Other obesity due to excess calories: Secondary | ICD-10-CM

## 2023-12-16 DIAGNOSIS — E039 Hypothyroidism, unspecified: Secondary | ICD-10-CM

## 2023-12-16 DIAGNOSIS — F3342 Major depressive disorder, recurrent, in full remission: Secondary | ICD-10-CM

## 2023-12-16 DIAGNOSIS — I10 Essential (primary) hypertension: Secondary | ICD-10-CM | POA: Diagnosis not present

## 2023-12-16 DIAGNOSIS — Z6834 Body mass index (BMI) 34.0-34.9, adult: Secondary | ICD-10-CM

## 2023-12-16 MED ORDER — BENAZEPRIL HCL 40 MG PO TABS: 40.0000 mg | ORAL_TABLET | Freq: Every day | ORAL | 1 refills | Status: AC

## 2023-12-16 MED ORDER — OMEPRAZOLE 20 MG PO CPDR: 20.0000 mg | DELAYED_RELEASE_CAPSULE | Freq: Every day | ORAL | 2 refills | Status: AC

## 2023-12-16 MED ORDER — PAROXETINE HCL 20 MG PO TABS: 20.0000 mg | ORAL_TABLET | Freq: Every day | ORAL | 1 refills | Status: AC

## 2023-12-16 MED ORDER — AMLODIPINE BESYLATE 10 MG PO TABS: 10.0000 mg | ORAL_TABLET | Freq: Every day | ORAL | 1 refills | Status: AC

## 2023-12-16 MED ORDER — HYDROCHLOROTHIAZIDE 25 MG PO TABS: 25.0000 mg | ORAL_TABLET | Freq: Every day | ORAL | 1 refills | Status: AC

## 2023-12-16 NOTE — Assessment & Plan Note (Signed)
 Rechecking labs today. Await results. Treat as needed.

## 2023-12-16 NOTE — Assessment & Plan Note (Signed)
 Under good control on current regimen. Continue current regimen. Continue to monitor. Call with any concerns. Refills given. Labs drawn today.

## 2023-12-16 NOTE — Assessment & Plan Note (Signed)
 Under good control on current regimen. Continue current regimen. Continue to monitor. Call with any concerns. Refills given.

## 2023-12-16 NOTE — Assessment & Plan Note (Signed)
 Tolerating his wegovy  well with 5.8% of body weight down 5 weeks into treatment. Will continue 0.5mg  and check back in in about 2 weeks to see if he'd like to continue titration or stay at 0.5mg . His company is requiring him to switch GLP-1 care to a specific group. Will continue regular care.

## 2023-12-16 NOTE — Progress Notes (Signed)
 BP 126/89   Pulse 63   Temp 97.8 F (36.6 C) (Oral)   Ht 6' 2 (1.88 m)   Wt 259 lb 3.2 oz (117.6 kg)   SpO2 100%   BMI 33.28 kg/m    Subjective:    Patient ID: Anthony Hunter, male    DOB: Dec 31, 1965, 58 y.o.   MRN: [id number]  HPI: Anthony Hunter is a 58 y.o. male  Chief Complaint  Patient presents with   Obesity   OBESITY Duration: chronic Previous attempts at weight loss: has been doing diet and exercise, portion control, going to the gym 3x a week Complications of obesity: HTN, HLD, depression, varicose veins Peak weight: 275# Weight loss goal: 220# Weight loss to date: 16# Requesting obesity pharmacotherapy: yes Current weight loss supplements/medications: no Previous weight loss supplements/meds: no  HYPERTENSION / HYPERLIPIDEMIA Satisfied with current treatment? yes Duration of hypertension: chronic BP monitoring frequency: rarely BP medication side effects: no Past BP meds: hydrochlorothiazide , benazepril , amlodipine  Duration of hyperlipidemia: chronic Cholesterol medication side effects: N/A Cholesterol supplements: none Past cholesterol medications: none Medication compliance: excellent compliance Aspirin: no Recent stressors: no Recurrent headaches: no Visual changes: no Palpitations: no Dyspnea: no Chest pain: no Lower extremity edema: no Dizzy/lightheaded: no  DEPRESSION Mood status: controlled Satisfied with current treatment?: yes Symptom severity: mild  Duration of current treatment : chronic Side effects: no Medication compliance: excellent compliance Psychotherapy/counseling: no  Previous psychiatric medications: paxil  Depressed mood: no Anxious mood: no Anhedonia: no Significant weight loss or gain: no Insomnia: no  Fatigue: no Feelings of worthlessness or guilt: no Impaired concentration/indecisiveness: no Suicidal ideations: no Hopelessness: no Crying spells: no    11/01/2023    2:14 PM 07/14/2023    9:00 AM 01/17/2023   11:04 AM  07/13/2022    3:06 PM 06/04/2022    2:50 PM  Depression screen PHQ 2/9  Decreased Interest 0 0 0 0 0  Down, Depressed, Hopeless 0 0 0 0 0  PHQ - 2 Score 0 0 0 0 0  Altered sleeping 0 2 0 3 3  Tired, decreased energy 0 0 0 0 0  Change in appetite 0 0 0 0 0  Feeling bad or failure about yourself  0 0 0 0 0  Trouble concentrating 0 0 0 0 0  Moving slowly or fidgety/restless 0 0 0 0 0  Suicidal thoughts 0 0 0 0 0  PHQ-9 Score 0 2 0 3 3  Difficult doing work/chores  Not difficult at all Not difficult at all Not difficult at all Not difficult at all    HYPOTHYROIDISM Thyroid control status:controlled Satisfied with current treatment? yes Medication side effects: no Medication compliance: excellent compliance Recent dose adjustment:no Fatigue: no Cold intolerance: no Heat intolerance: no Weight gain: no Weight loss: no Constipation: no Diarrhea/loose stools: no Palpitations: no Lower extremity edema: no Anxiety/depressed mood: no  Relevant past medical, surgical, family and social history reviewed and updated as indicated. Interim medical history since our last visit reviewed. Allergies and medications reviewed and updated.  Review of Systems  Constitutional: Negative.   Respiratory: Negative.    Cardiovascular: Negative.   Musculoskeletal: Negative.   Neurological: Negative.   Psychiatric/Behavioral: Negative.      Per HPI unless specifically indicated above     Objective:    BP 126/89   Pulse 63   Temp 97.8 F (36.6 C) (Oral)   Ht 6' 2 (1.88 m)   Wt 259 lb 3.2 oz (117.6 kg)  SpO2 100%   BMI 33.28 kg/m   Wt Readings from Last 3 Encounters:  12/16/23 259 lb 3.2 oz (117.6 kg)  11/01/23 269 lb (122 kg)  09/13/23 275 lb 6.4 oz (124.9 kg)    Physical Exam Vitals and nursing note reviewed.  Constitutional:      General: He is not in acute distress.    Appearance: Normal appearance. He is not ill-appearing, toxic-appearing or diaphoretic.  HENT:     Head:  Normocephalic and atraumatic.     Right Ear: External ear normal.     Left Ear: External ear normal.     Nose: Nose normal.     Mouth/Throat:     Mouth: Mucous membranes are moist.     Pharynx: Oropharynx is clear.  Eyes:     General: No scleral icterus.       Right eye: No discharge.        Left eye: No discharge.     Extraocular Movements: Extraocular movements intact.     Conjunctiva/sclera: Conjunctivae normal.     Pupils: Pupils are equal, round, and reactive to light.  Cardiovascular:     Rate and Rhythm: Normal rate and regular rhythm.     Pulses: Normal pulses.     Heart sounds: Normal heart sounds. No murmur heard.    No friction rub. No gallop.  Pulmonary:     Effort: Pulmonary effort is normal. No respiratory distress.     Breath sounds: Normal breath sounds. No stridor. No wheezing, rhonchi or rales.  Chest:     Chest wall: No tenderness.  Musculoskeletal:        General: Normal range of motion.     Cervical back: Normal range of motion and neck supple.  Skin:    General: Skin is warm and dry.     Capillary Refill: Capillary refill takes less than 2 seconds.     Coloration: Skin is not jaundiced or pale.     Findings: No bruising, erythema, lesion or rash.  Neurological:     General: No focal deficit present.     Mental Status: He is alert and oriented to person, place, and time. Mental status is at baseline.  Psychiatric:        Mood and Affect: Mood normal.        Behavior: Behavior normal.        Thought Content: Thought content normal.        Judgment: Judgment normal.     Results for orders placed or performed in visit on 11/01/23  Basic metabolic panel with GFR   Collection Time: 11/01/23  2:38 PM  Result Value Ref Range   Glucose 100 (H) 70 - 99 mg/dL   BUN 20 6 - 24 mg/dL   Creatinine, Ser 8.98 0.76 - 1.27 mg/dL   eGFR 87 >40 fO/fpw/8.26   BUN/Creatinine Ratio 20 9 - 20   Sodium 134 134 - 144 mmol/L   Potassium 3.3 (L) 3.5 - 5.2 mmol/L    Chloride 99 96 - 106 mmol/L   CO2 21 20 - 29 mmol/L   Calcium  9.3 8.7 - 10.2 mg/dL      Assessment & Plan:   Problem List Items Addressed This Visit       Cardiovascular and Mediastinum   Hypertension   Under good control on current regimen. Continue current regimen. Continue to monitor. Call with any concerns. Refills given. Labs drawn today.       Relevant Medications  amLODipine  (NORVASC ) 10 MG tablet   benazepril  (LOTENSIN ) 40 MG tablet   hydrochlorothiazide  (HYDRODIURIL ) 25 MG tablet   Other Relevant Orders   CBC with Differential/Platelet   Comprehensive metabolic panel with GFR     Endocrine   Hypothyroidism   Rechecking labs today. Await results. Treat as needed.       Relevant Orders   CBC with Differential/Platelet   Comprehensive metabolic panel with GFR   TSH     Other   Hyperlipidemia   Under good control on current regimen. Continue current regimen. Continue to monitor. Call with any concerns. Refills given. Labs drawn today.       Relevant Medications   amLODipine  (NORVASC ) 10 MG tablet   benazepril  (LOTENSIN ) 40 MG tablet   hydrochlorothiazide  (HYDRODIURIL ) 25 MG tablet   Other Relevant Orders   CBC with Differential/Platelet   Comprehensive metabolic panel with GFR   Lipid Panel w/o Chol/HDL Ratio   Depression   Under good control on current regimen. Continue current regimen. Continue to monitor. Call with any concerns. Refills given.        Relevant Medications   PARoxetine  (PAXIL ) 20 MG tablet   Other Relevant Orders   CBC with Differential/Platelet   Comprehensive metabolic panel with GFR   Class 1 obesity due to excess calories with serious comorbidity and body mass index (BMI) of 34.0 to 34.9 in adult - Primary   Tolerating his wegovy  well with 5.8% of body weight down 5 weeks into treatment. Will continue 0.5mg  and check back in in about 2 weeks to see if he'd like to continue titration or stay at 0.5mg . His company is requiring him to  switch GLP-1 care to a specific group. Will continue regular care.       Relevant Orders   CBC with Differential/Platelet   Comprehensive metabolic panel with GFR     Follow up plan: Return in about 7 months (around 07/15/2024) for physical, OK to cancel appt in December.

## 2023-12-17 LAB — COMPREHENSIVE METABOLIC PANEL WITH GFR
ALT: 22 IU/L (ref 0–44)
AST: 22 IU/L (ref 0–40)
Albumin: 4.8 g/dL (ref 3.8–4.9)
Alkaline Phosphatase: 77 IU/L (ref 47–123)
BUN/Creatinine Ratio: 10 (ref 9–20)
BUN: 10 mg/dL (ref 6–24)
Bilirubin Total: 0.6 mg/dL (ref 0.0–1.2)
CO2: 24 mmol/L (ref 20–29)
Calcium: 9.6 mg/dL (ref 8.7–10.2)
Chloride: 99 mmol/L (ref 96–106)
Creatinine, Ser: 1.01 mg/dL (ref 0.76–1.27)
Globulin, Total: 2.5 g/dL (ref 1.5–4.5)
Glucose: 87 mg/dL (ref 70–99)
Potassium: 3.9 mmol/L (ref 3.5–5.2)
Sodium: 137 mmol/L (ref 134–144)
Total Protein: 7.3 g/dL (ref 6.0–8.5)
eGFR: 87 mL/min/1.73 (ref >59)

## 2023-12-17 LAB — CBC WITH DIFFERENTIAL/PLATELET
Basophils Absolute: 0 x10E3/uL (ref 0.0–0.2)
Basos: 0 %
EOS (ABSOLUTE): 0.1 x10E3/uL (ref 0.0–0.4)
Eos: 2 %
Hematocrit: 48.8 % (ref 37.5–51.0)
Hemoglobin: 16 g/dL (ref 13.0–17.7)
Immature Grans (Abs): 0 x10E3/uL (ref 0.0–0.1)
Immature Granulocytes: 0 %
Lymphocytes Absolute: 2.2 x10E3/uL (ref 0.7–3.1)
Lymphs: 32 %
MCH: 31.3 pg (ref 26.6–33.0)
MCHC: 32.8 g/dL (ref 31.5–35.7)
MCV: 96 fL (ref 79–97)
Monocytes Absolute: 0.5 x10E3/uL (ref 0.1–0.9)
Monocytes: 7 %
Neutrophils Absolute: 3.9 x10E3/uL (ref 1.4–7.0)
Neutrophils: 59 %
Platelets: 261 x10E3/uL (ref 150–450)
RBC: 5.11 x10E6/uL (ref 4.14–5.80)
RDW: 12.2 % (ref 11.6–15.4)
WBC: 6.7 x10E3/uL (ref 3.4–10.8)

## 2023-12-17 LAB — TSH: TSH: 3.29 u[IU]/mL (ref 0.450–4.500)

## 2023-12-17 LAB — LIPID PANEL W/O CHOL/HDL RATIO
Cholesterol, Total: 264 mg/dL — ABNORMAL HIGH (ref 100–199)
HDL: 45 mg/dL (ref >39)
LDL Chol Calc (NIH): 172 mg/dL — ABNORMAL HIGH (ref 0–99)
Triglycerides: 250 mg/dL — ABNORMAL HIGH (ref 0–149)
VLDL Cholesterol Cal: 47 mg/dL — ABNORMAL HIGH (ref 5–40)

## 2023-12-26 ENCOUNTER — Ambulatory Visit: Payer: Self-pay | Admitting: Family Medicine

## 2024-01-16 ENCOUNTER — Ambulatory Visit: Admitting: Family Medicine

## 2024-07-17 ENCOUNTER — Encounter: Admitting: Family Medicine
